# Patient Record
Sex: Male | Born: 1940 | Race: White | Hispanic: No | Marital: Married | Smoking: Former smoker
Health system: Southern US, Community
[De-identification: ages and names within clinical notes are randomized; demographics above are authoritative.]

## PROBLEM LIST (undated history)

## (undated) DIAGNOSIS — G4730 Sleep apnea, unspecified: Secondary | ICD-10-CM

## (undated) DIAGNOSIS — J45909 Unspecified asthma, uncomplicated: Secondary | ICD-10-CM

## (undated) DIAGNOSIS — J309 Allergic rhinitis, unspecified: Secondary | ICD-10-CM

## (undated) HISTORY — DX: Sleep apnea, unspecified: G47.30

## (undated) HISTORY — DX: Allergic rhinitis, unspecified: J30.9

## (undated) HISTORY — PX: ROTATOR CUFF REPAIR: SHX139

## (undated) HISTORY — DX: Unspecified asthma, uncomplicated: J45.909

---

## 2001-10-07 HISTORY — PX: KNEE SURGERY: SHX244

## 2004-05-24 ENCOUNTER — Inpatient Hospital Stay (HOSPITAL_COMMUNITY): Admission: RE | Admit: 2004-05-24 | Discharge: 2004-05-28 | Payer: Self-pay | Admitting: Orthopaedic Surgery

## 2004-10-17 ENCOUNTER — Encounter: Admission: RE | Admit: 2004-10-17 | Discharge: 2004-10-17 | Payer: Self-pay | Admitting: Orthopedic Surgery

## 2004-10-31 ENCOUNTER — Encounter: Admission: RE | Admit: 2004-10-31 | Discharge: 2004-10-31 | Payer: Self-pay | Admitting: Orthopedic Surgery

## 2005-01-14 ENCOUNTER — Ambulatory Visit: Payer: Self-pay | Admitting: Internal Medicine

## 2005-05-14 ENCOUNTER — Ambulatory Visit: Payer: Self-pay | Admitting: Infectious Diseases

## 2005-05-14 ENCOUNTER — Inpatient Hospital Stay (HOSPITAL_COMMUNITY): Admission: RE | Admit: 2005-05-14 | Discharge: 2005-05-17 | Payer: Self-pay | Admitting: Orthopaedic Surgery

## 2005-05-14 ENCOUNTER — Encounter (INDEPENDENT_AMBULATORY_CARE_PROVIDER_SITE_OTHER): Payer: Self-pay | Admitting: *Deleted

## 2005-06-13 ENCOUNTER — Ambulatory Visit: Payer: Self-pay | Admitting: Infectious Diseases

## 2005-06-20 ENCOUNTER — Encounter: Admission: RE | Admit: 2005-06-20 | Discharge: 2005-06-20 | Payer: Self-pay | Admitting: Orthopaedic Surgery

## 2005-06-27 ENCOUNTER — Ambulatory Visit: Payer: Self-pay | Admitting: Infectious Diseases

## 2005-09-18 ENCOUNTER — Inpatient Hospital Stay (HOSPITAL_COMMUNITY): Admission: RE | Admit: 2005-09-18 | Discharge: 2005-09-21 | Payer: Self-pay | Admitting: Orthopaedic Surgery

## 2005-09-18 ENCOUNTER — Encounter (INDEPENDENT_AMBULATORY_CARE_PROVIDER_SITE_OTHER): Payer: Self-pay | Admitting: *Deleted

## 2005-09-18 ENCOUNTER — Ambulatory Visit: Payer: Self-pay | Admitting: Internal Medicine

## 2005-09-19 ENCOUNTER — Encounter: Payer: Self-pay | Admitting: Cardiology

## 2005-09-19 ENCOUNTER — Ambulatory Visit: Payer: Self-pay | Admitting: Cardiology

## 2005-11-01 ENCOUNTER — Ambulatory Visit: Payer: Self-pay | Admitting: Internal Medicine

## 2006-04-08 ENCOUNTER — Ambulatory Visit (HOSPITAL_COMMUNITY): Admission: RE | Admit: 2006-04-08 | Discharge: 2006-04-09 | Payer: Self-pay | Admitting: Orthopaedic Surgery

## 2006-04-08 ENCOUNTER — Ambulatory Visit: Payer: Self-pay | Admitting: Internal Medicine

## 2006-05-09 ENCOUNTER — Ambulatory Visit: Payer: Self-pay | Admitting: Infectious Diseases

## 2006-05-09 ENCOUNTER — Inpatient Hospital Stay (HOSPITAL_COMMUNITY): Admission: RE | Admit: 2006-05-09 | Discharge: 2006-05-12 | Payer: Self-pay | Admitting: Orthopedic Surgery

## 2006-07-09 ENCOUNTER — Ambulatory Visit: Payer: Self-pay | Admitting: Infectious Diseases

## 2006-07-11 DIAGNOSIS — J449 Chronic obstructive pulmonary disease, unspecified: Secondary | ICD-10-CM

## 2006-07-11 DIAGNOSIS — K219 Gastro-esophageal reflux disease without esophagitis: Secondary | ICD-10-CM

## 2006-07-11 DIAGNOSIS — M869 Osteomyelitis, unspecified: Secondary | ICD-10-CM | POA: Insufficient documentation

## 2006-07-31 IMAGING — CR DG CHEST 1V PORT
1 series · 1 of 1 positions shown · non-contrast
Comparison: 05/09/05.

CLINICAL DATA: PICC line placement.
 PORTABLE CHEST RADIOGRAPH ? 05/14/05:

[view not recorded]
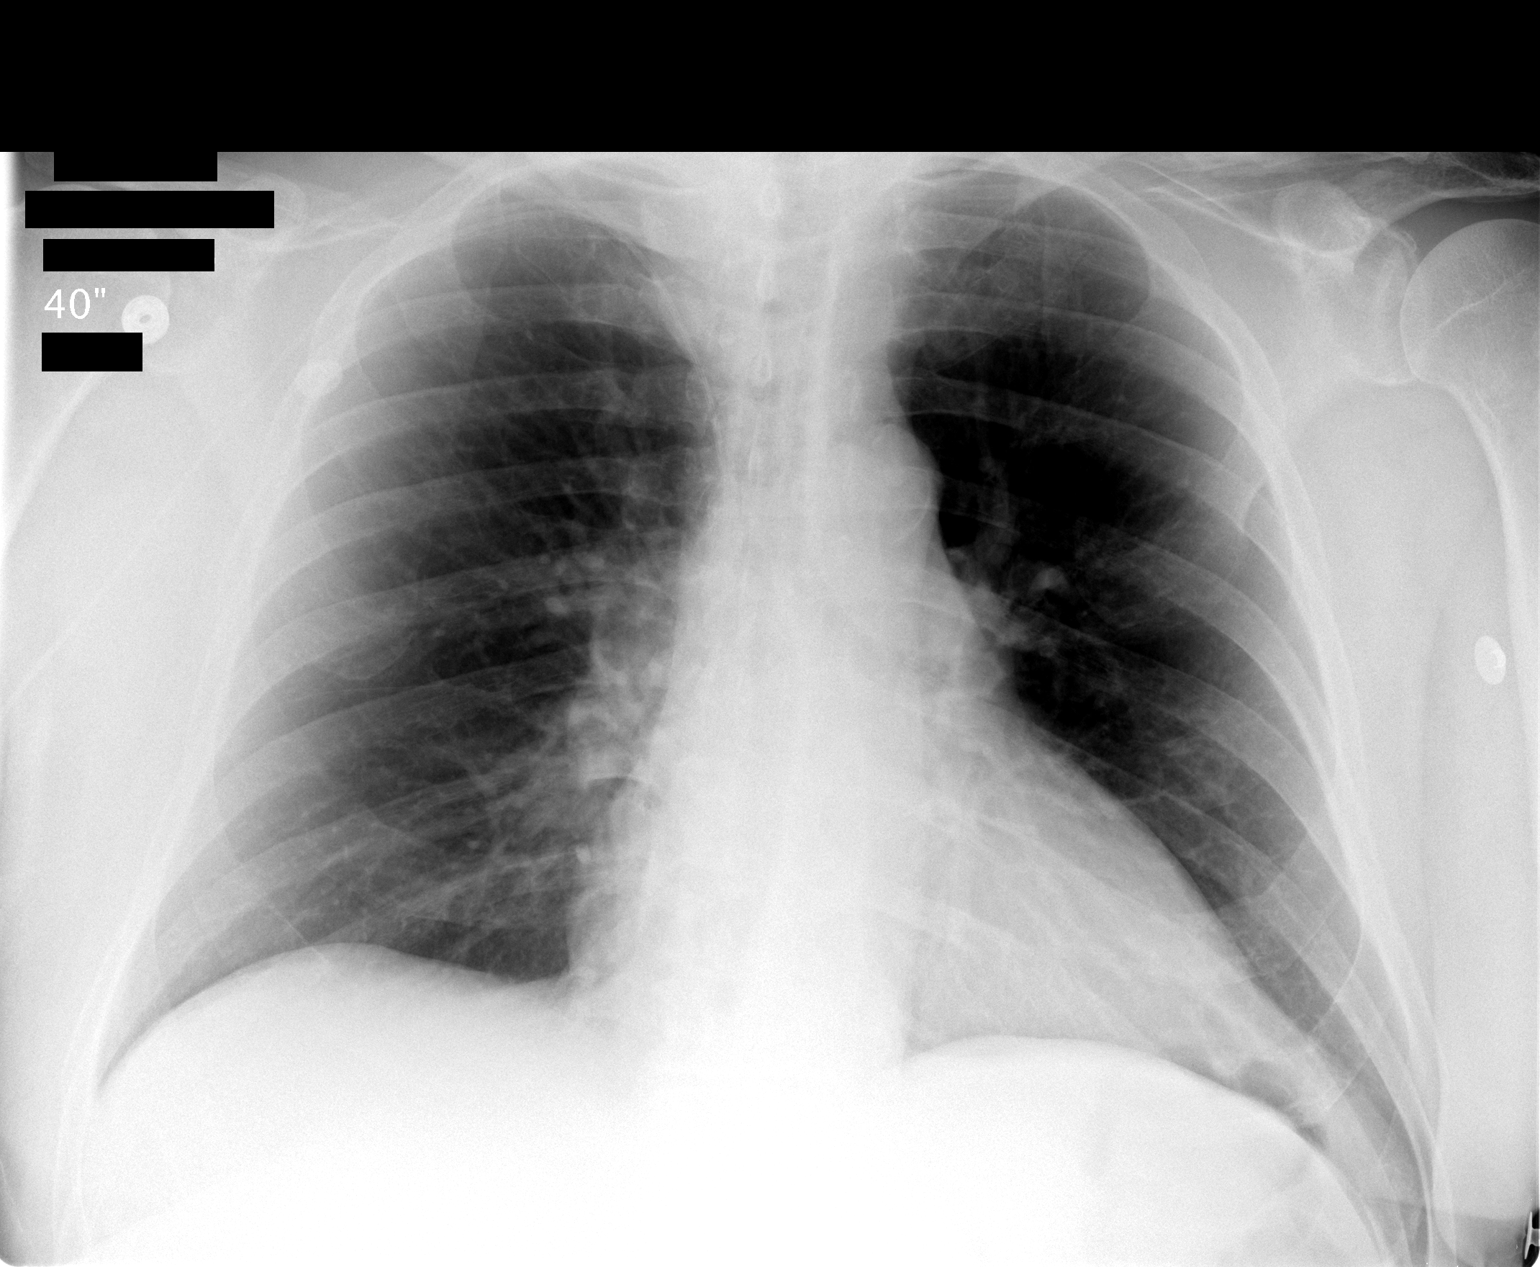

[1 of 1 positions shown; findings below may reference images not displayed]

FINDINGS: A right-sided PICC line is present with the tip in the superior vena cava.  No pneumothorax.  The lungs appear clear.  
 Old left clavicular fracture is again noted.
IMPRESSION: Right-sided PICC line tip is in the superior vena cava.

## 2007-01-02 ENCOUNTER — Ambulatory Visit: Payer: Self-pay | Admitting: Internal Medicine

## 2007-08-04 DIAGNOSIS — G4700 Insomnia, unspecified: Secondary | ICD-10-CM | POA: Insufficient documentation

## 2007-08-04 DIAGNOSIS — J309 Allergic rhinitis, unspecified: Secondary | ICD-10-CM | POA: Insufficient documentation

## 2007-08-04 DIAGNOSIS — G4733 Obstructive sleep apnea (adult) (pediatric): Secondary | ICD-10-CM

## 2007-08-04 DIAGNOSIS — I493 Ventricular premature depolarization: Secondary | ICD-10-CM | POA: Insufficient documentation

## 2007-08-04 DIAGNOSIS — F988 Other specified behavioral and emotional disorders with onset usually occurring in childhood and adolescence: Secondary | ICD-10-CM | POA: Insufficient documentation

## 2007-08-04 DIAGNOSIS — G2589 Other specified extrapyramidal and movement disorders: Secondary | ICD-10-CM | POA: Insufficient documentation

## 2008-01-05 ENCOUNTER — Ambulatory Visit: Payer: Self-pay | Admitting: Internal Medicine

## 2009-05-08 ENCOUNTER — Telehealth (INDEPENDENT_AMBULATORY_CARE_PROVIDER_SITE_OTHER): Payer: Self-pay | Admitting: *Deleted

## 2009-05-09 ENCOUNTER — Telehealth: Payer: Self-pay | Admitting: Internal Medicine

## 2009-05-09 ENCOUNTER — Ambulatory Visit: Payer: Self-pay | Admitting: Internal Medicine

## 2009-05-09 DIAGNOSIS — J40 Bronchitis, not specified as acute or chronic: Secondary | ICD-10-CM | POA: Insufficient documentation

## 2009-05-09 DIAGNOSIS — H6980 Other specified disorders of Eustachian tube, unspecified ear: Secondary | ICD-10-CM

## 2009-05-15 ENCOUNTER — Telehealth (INDEPENDENT_AMBULATORY_CARE_PROVIDER_SITE_OTHER): Payer: Self-pay | Admitting: *Deleted

## 2009-05-22 ENCOUNTER — Encounter: Payer: Self-pay | Admitting: Internal Medicine

## 2009-06-21 ENCOUNTER — Encounter: Payer: Self-pay | Admitting: Internal Medicine

## 2010-04-10 ENCOUNTER — Telehealth: Payer: Self-pay | Admitting: Internal Medicine

## 2010-05-10 ENCOUNTER — Telehealth: Payer: Self-pay | Admitting: Internal Medicine

## 2010-06-08 ENCOUNTER — Ambulatory Visit: Payer: Self-pay | Admitting: Internal Medicine

## 2010-11-06 NOTE — Progress Notes (Signed)
Summary: nos appt  Phone Note Call from Patient   Caller: [email or web address]  Call For: young Summary of Call: Rsc nos from 8/3 to 9/2 @ 3:15p. Pt states he didn't realize he had appt yesterday, didn't get a call. Initial call taken by: Darletta Moll,  May 10, 2010 10:13 AM

## 2010-11-06 NOTE — Assessment & Plan Note (Signed)
Summary: [name]   CC:  yearly follow up - states breathing is doing well.  no new complaints.  History of Present Illness: 01/05/08- One-year follow-up visit.  He describes a complicated series of repeated surgeries after a knee replacement got infected.  Then, he had a rotator cuff repair, and that also got infected.  He has worked with Dr. Maurice March.  Through this his breathing has been pretty good.  He does recognize the spring pollen is causing mild chest congestion now, but he feels his medications are adequate.  We also talked about his chronic insomnia problem and reviewed sleep habits and good sleep hygiene again.  Temazepam  has not been as effective.  We discussed and are going to try Lunesta. Post op leg pains wake him.  05/09/09- Asthma, rhinitis, hx OSA 4 weeks ago caught cold from grandchildren. Has had persitent cough wiuth phlegm, but despite efforts of his primary MD, he has been stopped up in head, very decreased hearing. Has  been falling out of bed. Denies worse than normal sinus congestion. Coughing up green. Took a sulfa drug x 10 days, nasal sprays. Using Nasonex. Tried Afrin.  2010-07-08- asthma, rhinitis, hx OSA Stable mostly. About 3x/yr gets cough needing cough syrup, self limited.  Uses Advair once daily and continues Theochron and Albuterol tablets especially if in full pollen season. Very occasional use of rescue inhaler.  Asthma History    Initial Asthma Severity Rating:    Age range: 12+ years    Symptoms: 0-2 days/week    Nighttime Awakenings: 0-2/month    Interferes w/ normal activity: no limitations    SABA use (not for EIB): 0-2 days/week    Asthma Severity Assessment: Intermittent   Preventive Screening-Counseling & Management  Alcohol-Tobacco     Smoking Status: quit > 6 months     Year Quit: 1965  Current Medications (verified): 1)  Prilosec 20 Mg Cpdr (Omeprazole) .... One Bid 2)  Albuterol Sulfate 4 Mg Tabs (Albuterol Sulfate) .... One Bid 3)   Advair Diskus 250-50 Mcg/dose Misc (Fluticasone-Salmeterol) .... Inhale One Puff Two Times A Day   Rinse Mouth After Each Use 4)  Theochron 200 Mg Tb12 (Theophylline) .... One Bid 5)  Nasonex 50 Mcg/act Susp (Mometasone Furoate) .... 2 Sprays in Each Nostril Once Daily 6)  Furosemide 40 Mg Tabs (Furosemide) .... One Half Tablet in The Am 7)  Claritin 10 Mg Tabs (Loratadine) .... Take 1 Tablet By Mouth Once A Day As Needed 8)  Albuterol 90 Mcg/act Aers (Albuterol) .... Prn 9)  Lopressor 50 Mg Tabs (Metoprolol Tartrate) .... One Half Tablet Am and Pm 10)  Valium 5 Mg  Tabs (Diazepam) .... Use As Directed As Needed 11)  Promethazine-Codeine 6.25-10 Mg/60ml Syrp (Promethazine-Codeine) .... Take 1 Tsp Every Four Hours As Needed For Cough  Allergies (verified): 1)  ! Penicillin 2)  ! 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  Married 2 dogs and 2 cats  Risk Factors: Smoking Status: quit > 6 months (07-08-2010)  Past Medical History: Asthma GERD Insomnia Rhinitis  Family History: Father- died DM, CHF age 62 Mother- died unknown cause, age 25  Social History: Patient states former smoker.  Married 2  dogs and 2 cats Smoking Status:  quit > 6 months  Review of Systems      See HPI       The patient complains of shortness of breath with activity and nasal congestion/difficulty breathing through nose.  The patient denies shortness of breath at rest, productive cough, non-productive cough, coughing up blood, chest pain, irregular heartbeats, acid heartburn, indigestion, loss of appetite, weight change, abdominal pain, difficulty swallowing, sore throat, tooth/dental problems,  headaches, itching, rash, change in color of mucus, and fever.    Vital Signs:  Patient profile:   70 year old male Weight:      235.38 pounds O2 Sat:      96 % on Room air Pulse rate:   72 / minute BP sitting:   140 / 66  (right arm) Cuff size:   regular  Vitals Entered By: Boone Master CNA/MA (June 08, 2010 3:57 PM)  O2 Flow:  Room air CC: yearly follow up - states breathing is doing well.  no new complaints Comments Medications reviewed with patient Daytime contact number verified with patient. Boone Master CNA/MA  June 08, 2010 3:58 PM    Physical Exam  Additional Exam:  General: A/Ox3; pleasant and cooperative, NAD,  SKIN: no rash, lesions NODES: no lymphadenopathy HEENT: [place]/AT, EOM- WNL, Conjuctivae- clear, PERRLA, TM-WN, and clearL, Nose- nasal, Throat- clear and wnl, MallampatiI II NECK: Supple w/ fair ROM, JVD- none, normal carotid impulses w/o bruits Thyroid- CHEST: Clear to P&A,  without dullness or rhonchi HEART: RRR, no m/g/r heard ABDOMEN: Overweight ZOX:WRUE, nl pulses, no edema  NEURO: Grossly intact to observation      Impression & Recommendations:  Problem # 1:  ASTHMA (ICD-493.90) We discussed his meds. He feels comfortable continuing Theochron and Albuterol tabs, but we discussed these. I would prefer he use the Advair two times a day and minimize these others. I encouraged him to try skipping doses as a way to test his need.  Problem # 2:  ALLERGIC RHINITIS (ICD-477.9) Occasional need for antihistamines. i asked he use these and occasional decongestant . If he has flare he may need additional steroid nasal spray. His updated medication list for this problem includes:    Nasonex 50 Mcg/act Susp (Mometasone furoate) .Marland Kitchen... 2 sprays in each nostril once daily    Claritin 10 Mg Tabs (Loratadine) .Marland Kitchen... 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  Medications Added to Medication List This Visit: 1)  Claritin 10 Mg Tabs (Loratadine) .... Take 1 tablet by mouth once a day as needed  Other Orders: Est. Patient Level III (45409) Flu Vaccine 66yrs + (81191) Administration Flu vaccine - MCR (Y7829)  Patient Instructions: 1)  Please schedule a follow-up appointment in 1 year. 2)  Call as needed  3)  consider trying without Theochron and Albuterol tablets some to see if they are really worth taking. 4)  Flu vax   Immunization History:  Influenza Immunization History:    Influenza:  historical (07/07/2009)   Flu Vaccine Consent Questions     Do you have a history of severe allergic reactions to this vaccine? no    Any prior history of allergic reactions to egg and/or gelatin? no    Do you have a sensitivity to the preservative Thimersol? no    Do you have  a past history of Guillan-Barre Syndrome? no    Do you currently have an acute febrile illness? no    Have you ever had a severe reaction to latex? no    Vaccine information given and explained to patient? yes    Are you currently pregnant? no    Lot Number:[id number]   Exp Date:04/06/2011   Site Given  Left Deltoid IMflu   Randell Loop CMA  June 08, 2010 4:47 PM

## 2010-11-06 NOTE — Progress Notes (Signed)
Summary: Theophylline Refill  Phone Note Refill Request Message from:  Fax from Pharmacy on April 10, 2010 3:58 PM  Refills Requested: Medication #1:  THEOCHRON 200 MG TB12 one BID   Dosage confirmed as above?Dosage Confirmed   Brand Name Necessary? No   Supply Requested: 1 month   Last Refilled: 01/31/2010   Notes: Last seen by Dr. Maple Hudson on 05/09/2009 [facility],    Method Requested: Electronic Next Appointment Scheduled: 05/09/2010 w/ Dr. Maple Hudson Initial call taken by: Michel Bickers CMA,  April 10, 2010 3:59 PM  Follow-up for Phone Call        Is this okay to fill? Please advise.Michel Bickers CMA  April 10, 2010 4:00 PM  Additional Follow-up for Phone Call Additional follow up Details #1::        ok to refill x 5 months Additional Follow-up by: [name] MD,  April 11, 2010 8:49 AM    Prescriptions: THEOCHRON 200 MG TB12 (THEOPHYLLINE) one BID  #60 x 5   Entered by:   Randell Loop CMA   Authorized by:   [name] MD   Signed by:   Randell Loop CMA on 04/11/2010   Method used:   Electronically to        [facility] (retail)       [phone]       [place], [place]  [id number]       Ph: [phone]       Fax: [phone]   RxID:   [id number]

## 2011-02-22 NOTE — Discharge Summary (Signed)
NAMENORRIS, [name]                 ACCOUNT NO.:  [id number]   MEDICAL RECORD NO.:  [id number]          PATIENT TYPE:  INP   LOCATION:  5019                         FACILITY:  [facility]   PHYSICIAN:  Claude Manges. Whitfield, M.D.DATE OF BIRTH:  December 18, 1940   DATE OF ADMISSION:  05/14/2005  DATE OF DISCHARGE:  05/17/2005                                 DISCHARGE SUMMARY   ADMISSION DIAGNOSIS:  1.  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  2.  Asthma.  3.  Reflux disease.  4.  Peripheral edema.  5.  History of ulcers.   DISCHARGE DIAGNOSIS:  1.  Status post left total knee arthroplasty hardware removal with      antibiotic spacer replacement.  2.  Hypokalemia, resolved.  3.  Acute blood loss anemia secondary to surgery requiring no blood      transfusions.  4.  History of asthma.  5.  History of reflux disease.  6.  History of peripheral edema.  7.  History of ulcers.   HISTORY OF PRESENT ILLNESS:  Mr. Gatliff is a 70 year old black male with a  history of left total knee arthroplasty August 2005.  The patient with an  increased discomfort in the left knee with some increased edema.  The  patient notes that occasionally pain radiates down the leg on the lateral  aspect.  Evaluation of the knee prosthesis radiographically showed no  loosening of the prosthesis, but the aspiration did show white count of  46,000 with no growth.  The patient assumed to have infected left total knee  arthroplasty.  The patient was placed on Cipro and _________ prior to being  hospitalized to undergo left total knee arthroplasty hardware removal with  antibiotic spacer on May 14, 2005.   ALLERGIES:  PENICILLIN.   MEDS:  1.  Theophylline 200 mg on p.o. b.i.d.  2.  Nexium 40 mg one p.o. daily.  3.  Albuterol 4 mg p.o. b.i.d.  4.  Lasix 20 mg p.o. b.i.d.  5.  Diclofenac 75 mg one p.o. b.i.d.  6.  __________ 300 mg one p.o. b.i.d.  7.  Cipro 500 mg one p.o. b.i.d.  8.  Advair 250/50 one puff b.i.d.  9.  Nasonex one spray b.i.d.  10. Temazepam 30 mg one p.o. q.h.s. p.r.n.   SURGICAL PROCEDURE:  The patient was taken to the operating room on May 14, 2005 by Dr. Norlene Campbell assisted by Nathanial Rancher, P.A.  The patient  was placed under general anesthesia and left total knee arthroplasty  components were removed.  The patient underwent the synovectomy and  antibiotic spacer was inserted.  The patient tolerated the procedure well  and returned to recovery in good stable condition.   HOSPITAL COURSE:  Postoperatively the patient was evaluated by Infectious  Disease and was placed on Vancomycin based on intraoperative cultures which  were negative.  The patient was being treated for Staph.   On postoperative day one the patient's T-max of 100.2, otherwise vital signs  stable.  H&H stable at 11.0  and 32.7.  The patient's hypokalemia went from  3.3, and potassium was replaced.   Postoperative day two the patient afebrile, vital signs stable, H&H 10.4 and  30.9, potassium had increased to 3.5.  The patient otherwise without chest  pain, shortness of breath, nausea or vomiting.  Tolerating diet.  Pain under  adequate control.  On postoperative day three the patient afebrile, vital  signs stable.  H&H was 10.4 and 30.8.  Cultures remained negative. The  patient to be discharged home on Vancomycin per pharmacy  protocol, follow-  up with Dr. ___________.   LABS:  Routine labs on admission, CBC:  White count 7,500, hemoglobin 13.1,  hematocrit 40.0, platelets 393.  Coags on admission, all values within  normal limits.  Routine chemistries on admission, all values within normal  limits.  Hepatic enzymes on admission, all values within normal limits.  Synovial fluid count on May 14, 2005 color was abnormal red, appearance  turbid, white count blood cells were 46,000, neutrophils 97 high and  monocyte macrophage low at 2.  Urinalysis on admission was  negative.  Anaerobic cultures were negative.  Wound tissue cultures of the left knee,  Gram smear was negative x3 days.  Chest x-ray May 14, 2005 showed right-  sided PICC line tip in superior vena cava.  No pneumothorax, lungs otherwise  clear.  EKG on May 09, 2005 showed normal sinus rhythm with a heart rate  of 74 beats per minute, P-R interval was 146.   DISCHARGE INSTRUCTIONS:  MEDS:  The patient was to resume home meds except  for no Cipro or ___________ and no diclofenac while on Coumadin.  The  following meds will be added:  1.  Coumadin as directed by pharmacy at 6 p.m.  ____________.  2.  The patient was to take 1-1/2 5 mg tablets on Friday, Saturday and      Sunday, check blood on Monday and dose to be adjusted by home health      pharmacy at that time.  3.  OxyContin 10 mg one tablet every 12 hours p.r.n. pain.  4.  Percocet 5/325 1-2 tablets every 4-6 hours as needed for breakthrough      pain.  5.  Robaxin 500 mg 1-2 tablet every six hours as needed for spasm.  6.  Vancomycin 1250 mg q.24h. IV dose may be adjusted as outpatient [facility]      pharmacy.  Last dose is to be on June 11, 2005.  7.  Diet -- no restrictions.  8.  Activity -- the patient is weight-bearing as tolerated on the left leg,      use a walker.  9.  Wound care -- the patient is to keep wound clean and dry.  May shower      after no drainage from wound x2 days.  The patient is to notify Dr.      Hoy Register office for temps greater than 101.5, chills, pain not      relieved by meds, foul smelling drainage from wound.   FOLLOWUP:  The patient needs to follow-up with Dr. Cleophas Dunker in 7-10 days in  his office.  The patient to call for an appointment at [phone].   The patient is to follow-up with Dr. Maurice March in Infectious Disease in three  weeks.  The patient is to call for an appointment [phone].   Home health per [facility].      Richardean Canal, P.A.      Theron Arista  Kathi Der,  M.D.  Electronically Signed    GC/MEDQ  D:  07/25/2005  T:  07/25/2005  Job:  [id number]   cc:   Fransisco Hertz, M.D.  Fax: [phone]

## 2011-02-22 NOTE — H&P (Signed)
[name], [name]                             ACCOUNT NO.:  [id number]   MEDICAL RECORD NO.:  [id number]                   PATIENT TYPE:  INP   LOCATION:  NA                                   FACILITY:  [facility]   PHYSICIAN:  Claude Manges. Cleophas Dunker, M.D.            DATE OF BIRTH:  September 14, 1941   DATE OF ADMISSION:  05/24/2004  DATE OF DISCHARGE:                                HISTORY & PHYSICAL   CHIEF COMPLAINT:  Difficulty with range of motion, left knee.   HISTORY OF PRESENT ILLNESS:  The patient is a 70 year old white male with a  history of several-year progressively worsening difficulty with his left  knee.  The patient states that he has stiffness in the knee and difficulty  with initiation of ambulation due to instability in the knee and discomfort.  He does have mechanical symptoms, including popping and grinding.  He does  have sharp, shooting pains in the knee with awkward movements.  He does have  pain radiating down into the calf with muscle soreness and discomfort.  He  does have generalized deep aching sensation within the knee, which increases  with cold temperatures.   X-rays reveal end-stage osteoarthritis bilateral knees, left knee with bone-  on-bone medial compartment with large osteophytes and arthrosis of the  patellofemoral and lateral compartment.   ALLERGIES:  PENICILLIN and STRAWBERRIES.   CURRENT MEDICATIONS:  1. Albuterol sulfate 4 mg tablets, one tablet twice a day.  2. Albuterol MDI p.r.n.  3. Temazepam 30 mg p.o. q.h.s. p.r.n.  4. Diclofenac 75 mg one tablet p.o. b.i.d.  5. Nexium 40 mg p.o. daily.  6. Advair Diskus 250 mg one puff b.i.d.  7. Theophylline 200 mg p.o. b.i.d.  8. Beconase inhaler one puff b.i.d.  9. Vicodin p.r.n.   PAST MEDICAL HISTORY:  1. Significant asthma, which is fairly well-controlled, mostly exercise and     environmental triggers.  2. History of hiatal hernia.  3. History of esophageal strictures.  4. Significant  claustrophobia.   PAST SURGICAL HISTORY:  1. Tonsillectomy without any complications.  2. Esophageal stricture stretching x2 without any complications.   SOCIAL HISTORY:  The patient is a 70 year old slightly heavy-set white male.  He denies any history of smoking.  He does use occasional alcoholic  beverage.  He is married.  He does have several grown children.  He lives in  a [place] home.  He is currently employed as a Producer, television/film/video.   PRIMARY CARE PHYSICIAN:  Tarri Fuller, M.D.   Pulmonologist is Clinton D. Maple Hudson, M.D.   FAMILY MEDICAL HISTORY:  Mother is deceased from age-related issues, as is  his father.  He has one sister, alive in good medical health.   REVIEW OF SYSTEMS:  Positive for bilateral hearing aids on occasion,  otherwise just hard of hearing.  He does have some shortness of breath with  exertion  related to his asthma, which is fairly well-controlled on current  medications.  He does have occasional problems with reflux, which is fairly  well-controlled on the Nexium.   PHYSICAL EXAMINATION:  VITAL SIGNS:  Height is 5 feet 9 inches, weight is  259 pounds.  Blood pressure is  168/78, pulse is 76  and regular,  respirations 14 without exertion.  The patient is afebrile.  HEENT:  The patient is a healthy-appearing, slightly heavy-set, centrally  obese 70 year old white male.  He ambulates with a slow, deliberate walk.  He is able to get on and off the exam table without any obvious discomfort  or shortness of breath.  HEENT:  Head was normocephalic.  Pupils equal, round, and reactive and  accommodating to light.  Extraocular movements intact.  Sclerae were not  icteric.  External ears were without deformities.  Patient is slightly hard  of hearing.  He does not have his hearing aids in at this time.  Nasal  septum was midline.  Oral buccal mucosa is pink and moist and without  lesions.  NECK:  Supple, no palpable, lymphadenopathy.  Thyroid  region was nontender.  The patient had fairly good range of motion of his cervical spine without  any difficulty or tenderness.  CHEST:  Lung sounds were clear, no wheezes, rales, or rhonchi.  Lung sounds  were slightly distant.  CARDIAC:  Regular rate and rhythm, no murmurs, rubs, or gallops.  ABDOMEN:  Round, obese-like, soft, nontender, no hepatosplenomegaly  palpable.  CVA region was nontender.  EXTREMITIES:  Upper extremities were symmetric in size and shape.  He had  good range of motion of his shoulders, elbows, and wrists.  Motor strength  is 5/5.  Lower extremities:  Right and left hip had full extension, flexion  up to 130 degrees with 20 degrees internal-external rotation without  difficulty.  Bilateral knees were symmetrically sized and shaped.  They were  round and full-appearing, but no palpable effusion, no erythema or  ecchymosis.  He was not specifically tender over the medial or lateral joint  line on either knee.  He had coarse crepitus on the patella.  He had near-  full extension up to about 5 degrees lacking on the left, full extension on  the right, flexion back to 120 degrees bilaterally, no significant  instability.  The calves were nontender.  The ankles were symmetrical with  good dorsiflexion and plantar flexion.  PERIPHERAL VASCULAR:  Carotid pulses were 2+, no bruits.  Radial pulses 2+.  Dorsalis pedis pulses 2+.  He did have some trace pitting edema in the  anterior tibial region but no significant pigmentation changes.  NEUROLOGIC:  The patient was conscious, alert, and appropriate.  Held an  easy conversation with the examiner.  Cranial nerves II-XII were grossly  intact.  He had no gross neurologic defects noted.  BREASTS, RECTAL, GENITOURINARY:  Exams deferred at this time.   IMPRESSION:  1. End-stage osteoarthritis, bilateral knees, bone-on-bone medial     compartment with lateral and patellofemoral arthrosis, large spurs,    currently left knee more  symptomatic than right.  2. Asthma.  3. Hiatal hernia.  4. History of esophageal strictures.  5. Claustrophobia.   PLAN:  The patient will undergo all routine labs and tests prior to having a  left total knee arthroplasty by Dr. Cleophas Dunker on May 24, 2004.      Jamelle Rushing, P.A.  Claude Manges. Cleophas Dunker, M.D.    RWK/MEDQ  D:  05/16/2004  T:  05/16/2004  Job:  [id number]

## 2011-02-22 NOTE — H&P (Signed)
[name], [name]                 ACCOUNT NO.:  [id number]   MEDICAL RECORD NO.:  [id number]          PATIENT TYPE:  INP   LOCATION:  NA                           FACILITY:  [facility]   PHYSICIAN:  Claude Manges. Whitfield, M.D.DATE OF BIRTH:  [birth year]   DATE OF ADMISSION:  09/18/2005  DATE OF DISCHARGE:                                HISTORY & PHYSICAL   CHIEF COMPLAINT:  Infected left total knee, status post 6 weeks antibiotic  spacer and antibiotic treatment.   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  The patient did  well at first, however, in the fall of 2005, he developed increased swelling  and discomfort in the knee.  The patient was assumed to have infected left  total knee arthroplasty and underwent the removal of the left total knee  arthroplasty component and an antibiotic spacer was placed on May 14, 2005.  The patient then was treated with IV antibiotics through a PICC line  and soon developed a pseudomonas infection that originated from the PICC  line.  The patient was then placed on Cipro for 6 weeks.  He has been off of  the Cipro now since November 13.  Overall his knee now is doing quite well.  He has very little swelling in the knee and is able to ambulate well with  the assistance of a cane.  The patient is now ready to undergo a left total  knee arthroplasty revision on September 18, 2005.   ALLERGIES:  PENICILLIN.  Questionable WHEAT allergy.   CURRENT MEDICATIONS:  1.  Temazepam 30 mg p.o. nightly p.r.n.  2.  Theophylline 200 mg p.o. b.i.d.  3.  AcipHex 20 mg one daily nightly.  4.  Prilosec 20 mg two tablets every other day.  5.  Albuterol  4 mg p.o. b.i.d.  6.  Lasix 20 mg 1/2 tablet q.a.m. and 1/2 tablet q.p.m.  7.  Diclofenac 75 mg p.o. b.i.d.  8.  Advair 250/50 one puff b.i.d.  9.  Nasonex one puff b.i.d.  10. Benadryl OTC p.r.n.  11. Advil p.r.n. pain.   PAST  MEDICAL HISTORY:  Asthma, peripheral edema, reflux, history of ulcer  many years previous.   PAST SURGICAL HISTORY:  1.  Removal of left knee hardware, antibiotic spacer placement, May 14, 2005.  2.  Left total knee arthroplasty in 2005 with no complications from      anesthesia.  3.  Multiple esophageal dilatation procedures performed in the past with no      complications.  4.  The patient denies any complications of the anesthesia with the above      procedures.   SOCIAL HISTORY:  The patient is a 70 year old white male who denies any  history of smoking, has an occasional alcoholic beverage.  He is married.   PRIMARY CARE PHYSICIAN:  Dr. Debbrah Alar in [place], [place].   FAMILY HISTORY:  Mother is deceased at age 35  felt  to be due to age.  Father deceased with history of DT's, cardiac disease, congestive heart  failure, and diabetes.  He has one sister who is alive without significant  medical history.   REVIEW OF SYSTEMS:  The patient denies any recent cold or cough-like  symptoms.  Denies any chest pain, shortness of breath, PND, or orthopnea.  However, he does have shortness of breath with exertion which is related to  his asthma, but this only occurs if he is not taking his medications.  He is  hard of hearing, however, does not wear a hearing aid.  He does have gastric  reflux which is poorly controlled with his current regimen.  Otherwise  review of systems is negative or noncontributory.   PHYSICAL EXAMINATION:  GENERAL:  The patient is a well-developed, well-  nourished, male who walks with an antalgic gait and uses a cane.  His left  leg has limited range of motion.  The patient's mood and affect are  appropriate, talks easily with examiner.  VITAL SIGNS:  Height 5 feet 9 inches, weight 202 pounds.  Temperature 96.7  degrees Fahrenheit, blood pressure 110/78, pulse 62, respiratory rate 12.  HEENT:  Head is normocephalic and atraumatic without frontal  maxillary sinus  tenderness.  Sclerae anicteric, PERRLA, EOM's intact.  Conjunctivae pink and  moist.  Ears are without external ear deformities.  TM's are pearly gray  bilaterally.  Buccal mucosa is pink and moist and the patient has good  dentition.  Nose; nasal septum midline.  Nasal mucosa is pink and moist  without polyps.  NECK:  No lymphadenopathy.  The patient has good range of motion of the  cervical spine without pain.  No tenderness with palpation over the cervical  spine.  Carotids are 2+ bilaterally without bruits.  HEART:  Regular rate and rhythm, occasional skipped beat.  No murmurs, rubs,  or gallops noted.  LUNGS:  Clear to auscultation bilaterally.  No wheezing, rhonchi, or rales  noted.  ABDOMEN:  Soft and nontender.  Bowel sounds x4 quadrants.  No hepatomegaly  or splenomegaly noted.  NEUROLOGY:  The patient is alert and oriented x3.  Cranial nerves II-XII  grossly intact.  BREASTS:  GENITOURINARY:  RECTAL:  All deferred at this time.  EXTREMITIES:  The patient has full range of motion of the upper extremities  without pain.  Upper extremities are symmetric in size and shape  bilaterally.  Lower extremities; right hip has full range of motion without  pain.  Left hip is not examined due to the patient's left knee with very  little range of motion and he is unable to bend it greater than 10-15  degrees.  Right knee; full range of motion.  No effusion, no edema.  He has  full extension and flexes back to 110 to 115 degrees easily.  Palpation  along the joint line reveals no tenderness.  Left knee; slight edema, no  core, no erythema.  Midline incision is healed well.  No effusion.  Again he  has full extension and is able to flex 10 to 15 degrees at most.   Left lower extremity is mildly edematous, but not pitting.  Dorsal and pedal pulses are 2+ bilaterally.  He has full dorsiflexion and plantar flexion of  both ankles.  He has good sensation to light touch in the  toes bilaterally.   IMPRESSION:  1.  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  2.  Asthma.  3.  Gastroesophageal reflux disease.  4.  Peripheral edema.  5.  History of ulcers.   PLAN:  The patient is to be admitted to [facility] on September 18, 2005, to undergo a left total knee arthroplasty revision by Dr. Cleophas Dunker.  Prior to surgery, the patient had all preoperative labs and testing.      Richardean Canal, P.A.      Claude Manges. Cleophas Dunker, M.D.  Electronically Signed    GC/MEDQ  D:  09/12/2005  T:  09/12/2005  Job:  [id number]

## 2011-02-22 NOTE — Assessment & Plan Note (Signed)
[facility]                             PULMONARY OFFICE NOTE   ICKER, SWIGERT                          MRN:          [id number]  DATE:01/02/2007                            DOB:          07-24-1941    PROBLEM:  1. Chronic asthma/bronchitis.  2. Allergic rhinitis.  3. History of obstructive sleep apnea.  4. Periodic limb movement.  5. Adult attention deficit disorder.  6. Insomnia.  7. PVC's.   HISTORY:  Last seen in April of 2006, coming now to renew contact and  wanting refill on Jules Schick has worked well for insomnia.  With  weight loss his obstructive sleep apnea component has improved .  He is  very satisfied with his respiratory status and says that he does not  recognize allergic rhinitis or asthma.  Living now with a number of  animals including cats, dogs and hoarse's.  His primary physician gets  chest x-ray occasionally.  There has been no cough, significant phlegm,  blood or chest pain.   MEDICATIONS:  1. Advair 250/50 mg being used once daily.  2. Albuterol 4 mg tablets used rarely.  3. Theophylline  200 mg b.i.d.  4. Nasonex.  5. Temazepam 30 mg at bed time.  6. Metoprolol 25 mg b.i.d.   Question intolerance to wheat, otherwise no medication allergy.   OBJECTIVE:  Weight 221 pounds, blood pressure 128/78, pulse regular 68,  room air saturation 97%.  LUNGS:  Fields sound clear.  HEART:  Rhythm is regular without murmur or gallop.  He looks quite well.  I find no adenopathy, cyanosis or edema.   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   PLAN:  We discussed options including sleep medications in the context  of sleep apnea and I have agreed to refill Temazepam 30 mg at bedtime  p.r.n.  He will continue present medications, schedule to return in  one  year for follow up, earlier p.r.n.     Clinton D. Maple Hudson, MD, Tonny Bollman, FACP  Electronically Signed    CDY/MedQ  DD: 01/02/2007  DT: 01/02/2007  Job #: [id number]   cc:   Tarri Fuller

## 2011-02-22 NOTE — Op Note (Signed)
NAMEJAESEAN, [name]                             ACCOUNT NO.:  [id number]   MEDICAL RECORD NO.:  [id number]                   PATIENT TYPE:  INP   LOCATION:  2867                                 FACILITY:  [facility]   PHYSICIAN:  Claude Manges. Cleophas Dunker, M.D.            DATE OF BIRTH:  1941-06-09   DATE OF PROCEDURE:  05/24/2004  DATE OF DISCHARGE:                                 OPERATIVE REPORT   PREOPERATIVE DIAGNOSIS:  End stage osteoarthritis, left knee.   POSTOPERATIVE DIAGNOSIS:  End stage osteoarthritis, left knee.   PROCEDURE:  Left total knee replacement.   SURGEON:  Claude Manges. Cleophas Dunker, M.D.   ASSISTANT:  Legrand Pitts. Duffy, P.A.-C.   ANESTHESIA:  General orotracheal.   COMPLICATIONS:  None.   COMPONENTS:  DePuy LCS complete large femoral component, #4 rotating keel  tibia platform with a 10 mm bridging bearing and a metal back 3 peg rotating  patella, all were secured with polymethyl methacrylate.   PROCEDURE:  With the patient comfortable on the operating table and under  general endotracheal anesthesia, the nursing staff inserted a Foley  catheter.  A tourniquet was then applied to the left lower extremity.  The  left lower extremity was prepped with Betadine scrub and then DuraPrep from  the tourniquet to the mid foot, sterile draping was performed.  With the  extremity still elevated, it was Esmarch exsanguinated with a tourniquet at  350 mmHg.  A midline longitudinal incision was made about the left knee  centered about the patella and extending from the superior pouch to the  tibial tubercle.  By sharp dissection, the incision was carried down to the  subcutaneous tissue.  The first layer of capsule was incised in the midline.  A medial parapatellar incision was made with the Bovie.  There was a clear  yellow joint effusion.  The patella was everted 180 degrees and the knee  flexed to 90 degrees.  There were large osteophytes along the medial and  lateral femoral condyle.   There was complete absence of articular cartilage  in the medial compartment with a fixed varus position.  A medial release was  performed.   Preoperatively, we had measured a large femoral component and a #4 rotating  tibial platform.  These were confirmed interoperatively.  The first cut was  made traversing the tibia with a 9 degree posterior inclination.  A 10 mm  flexion and extension gap were perfectly symmetrical.  Osteophytes were  removed from the medial tibia after the medial release.  MCL and LCL  remained intact throughout the operative procedure.  The PCL and ACL were  excise.  Laminar spreaders were inserted in the medial and lateral  compartments to remove osteophytes from the posterior femoral condyle and  medial and lateral menisci and any remnants of ACL and PCL.  Femoral cuts  were then made with symmetrical flexion and extension gap.  Retractors were  placed carefully behind the tibia.  Tibial holes were then made to secure  the keel tibial tray.  The 12 tibial compartment was applied followed by the  10 mm bridging bearing and the large femoral component.  The trial  components were reduced.  The knee was placed through a full range of motion  without malrotation of the components.   The patella was prepared by removing 12 mm of bone leaving 15 mm patellar  thickness.  The tri-peg jig was applied to make the three holes in the  patella.  The trial patella was applied and reduced with a full range of  motion without subluxation.  The trial components were removed, the joint  was copiously irrigated with jet saline, the final components were inserted  with polymethyl methacrylate.  The tibia was removed first, excess  methacrylate was removed with Michaelle Copas.  The 10 mm bridging bearing was  inserted.  The femur was then impacted with methacrylate with excellent  position.  The patella was applied with the bone clamp and methacrylate and  after complete maturation and  hardening of the methacrylate, the joint was  explored.  Any remaining methacrylate was removed with an osteotome.  The  knee was placed through a full range of motion without malrotation of the  components or subluxation of the patella.   The joint was copiously irrigated with saline solution.  Any bleeding bone  edges were covered with bone wax.  The tourniquet was deflated with  immediate capillary refill to the operative site.  A Hemovac was inserted.  The deep capsule was closed with interrupted #1 Ethibond.  The superficial  capsule was closed with a running 0 Vicryl, the subcu was closed with a  running 2-0 Vicryl, and the skin was closed with skin clips.  A sterile,  bulky dressing was applied followed by the patient's support stocking.  The  patient had good pulses.  The patient tolerated the procedure without  complications.                                               Claude Manges. Cleophas Dunker, M.D.    PWW/MEDQ  D:  05/24/2004  T:  05/25/2004  Job:  [id number]

## 2011-02-22 NOTE — H&P (Signed)
[name], [name]                 ACCOUNT NO.:  [id number]   MEDICAL RECORD NO.:  [id number]          PATIENT TYPE:  INP   LOCATION:  NA                           FACILITY:  [facility]   PHYSICIAN:  Claude Manges. Whitfield, M.D.DATE OF BIRTH:  [birth year]   DATE OF ADMISSION:  05/14/2005  DATE OF DISCHARGE:                                HISTORY & PHYSICAL   CHIEF COMPLAINT:  Painful swollen left total knee arthroplasty.   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  Patient overall  had been doing fairly well, but recently had been having problems with  increased swelling and discomfort in his knee.  He said it would  occasionally radiate down the leg and on the lateral aspects.  Evaluation  showed no loosening of the prosthesis but an aspiration did show 46,000  white cells with no growth.  Patient was assumed to have an infected left  total knee arthroplasty.  Dr. Cleophas Dunker did discuss the case with infectious  disease and due to delay of the ability to surgically debride the knee, was  recommended to place him on Cipro and rifampin until the procedure could be  performed on August 8.   ALLERGIES:  PENICILLIN.   CURRENT MEDICATIONS:  1.  Temazepam 30 mg p.o. q.h.s. p.r.n.  2.  Theophylline 200 mg p.o. b.i.d.  3.  Nexium 40 mg p.o. daily.  4.  Albuterol 4 mg p.o. b.i.d.  5.  Lasix 20 mg p.o. b.i.d.  6.  Diclofenac 75 mg p.o. b.i.d.  7.  Rifampin 300 mg p.o. b.i.d.  8.  Cipro 500 mg p.o. b.i.d.  9.  Advair 250/50 one puff b.i.d.  10. Nasonex spray one puff b.i.d.   PAST MEDICAL HISTORY:  1.  Asthma.  2.  Peripheral edema.  3.  Reflux disease.  4.  History of ulcer many years previous.   PAST SURGICAL HISTORY:  1.  Left total knee arthroplasty in 2005 with no complications from      anesthesia.  2.  Patient has also had multiple esophageal stretchings performed in the      past without any  complications.   SOCIAL HISTORY:  Patient is a 70 year old white male.  Denies any history of  smoking.  Does have an alcoholic beverage usually on an average daily basis.  Is married.   Family physician is Dr. Carolin Coy in [facility].   FAMILY HISTORY:  Mother is deceased at the age of 47 due to age issues.  Father is deceased with a history of DTs, cardiac disease, congestive heart  failure, and diabetes.  One sister alive without any significant medical  history.   REVIEW OF SYSTEMS:  Positive for glasses.  He is hard of hearing.  Does not  use his hearing aids due to he feels they do not work very well.  He does  have some shortness of breath with exertion related to his asthma but his  asthma has been fairly well controlled as long as  he is on his medications.  He has never been hospitalized.  He does have issues with reflux but as long  as he is using his Nexium it is pretty well controlled.  Rest of review of  systems are negative.   PHYSICAL EXAMINATION:  VITAL SIGNS:  Height 5 feet 9 inches, weight 210,  blood pressure 152/58, pulse 76 and regular, respirations 12, nonlabored.  Patient is afebrile.  GENERAL:  This is an elderly-appearing white male.  Ambulates with a cane in  his right hand.  He does walk with a limp.  He easily gets on and off the  examination table.  HEENT:  Head is normocephalic.  Pupils are equal, round, and reactive,  accommodating to light.  External ears are without deformities.  Patient is  slightly hard of hearing.  No hearing aids in place.  He does ask you to  repeat occasional questions at normal voice tones.  Oral buccal mucosa is  pink and moist.  NECK:  Supple.  No palpable lymphadenopathy.  Patient has good range of  motion of her cervical spine without any difficulty or tenderness.  Thyroid  region is nontender.  LUNGS:  He did have a very slight wheeze in the right lower base, otherwise  he had lung sounds that were clear and patient  indicates this is a fairly  normal day respiratory wise.  HEART:  Regular rate and rhythm.  No murmurs, rubs, or gallops.  ABDOMEN:  Soft, nontender.  Bowel sounds normoactive.  No CVA region  tenderness.  EXTREMITIES:  Upper extremities were symmetrically size and shape.  He had  good range of motion of his shoulders, elbows, and wrists.  Lower  extremities:  Right and left hip had full extension, flexion up to 130  degrees with 20 degrees internal/external rotation, no discomfort.  Left  knee was rather swollen.  There was no erythema.  Midline incision was  healed very nicely.  It was not appreciable warmer than the right side.  He  had full extension.  He was able to flex it back to 100 degrees fairly  easily.  No instability.  The calves are soft and nontender.  Right knee is  normal-appearing.  No erythema.  No swelling.  He has full range of motion.  The calves are nontender.  Ankles are symmetrical with good dorsiplantar  flexion.  PERIPHERAL VASCULAR:  Carotid pulses were 2+.  No bruits.  Radial pulses  were 2+.  He had 2+ dorsalis pedis in both legs.  He had moderate amount of  swelling in his left lower extremity from the tibia on down and trace edema  in both legs.  No varicosities or pigmentation changes.  NEUROLOGIC:  Patient was conscious, alert, and appropriate.  Ease in  conversation with examiner.  Cranial nerves II-XII grossly intact.  He had  no gross neurologic defects noted.  BREASTS:  Deferred at this time.  RECTAL:  Deferred at this time.  GENITOURINARY:  Deferred at this time.   IMPRESSION:  1.  Infected left total knee arthroplasty, currently taking Cipro and      Rifampin.  2.  Asthma.  3.  Reflux disease.  4.  Peripheral edema.  5.  History of ulcers.   PLAN:  Patient will undergo all other routine laboratories and tests prior  to having an I&D and poly exchange of his left total knee arthroplasty by Dr. Cleophas Dunker at [facility] on August 8.  I  did discuss  the patient  if the prosthesis components were loose that they would probably be removed  and an artificial spacer with antibiotics six to eight weeks was a  possibility.  Otherwise, will go ahead and proceed with just an I&D with  poly exchange.       RWK/MEDQ  D:  05/09/2005  T:  05/09/2005  Job:  [id number]

## 2011-02-22 NOTE — Consult Note (Signed)
NAME:  [name], [name]                 ACCOUNT NO.:  [id number]   MEDICAL RECORD NO.:  [id number]          PATIENT TYPE:  OIB   LOCATION:  4731                         FACILITY:  [facility]   PHYSICIAN:  Pricilla Riffle, M.D.    DATE OF BIRTH:  Sep 13, 1941   DATE OF CONSULTATION:  04/08/2006  DATE OF DISCHARGE:                                   CONSULTATION   IDENTIFICATION:  The patient is a 70 year old gentleman who we were asked to  see regarding SVT.   HISTORY OF PRESENT ILLNESS:  The patient has no known history of coronary  artery disease.  He was seen in December 2006 by Berton Mount for PVCs post  surgery.  Follow-up in clinic, thought to probably be arising from the RV  outflow tract.  The patient was asymptomatic, Rx with Lopressor.  Echocardiogram:  LVF 55-65%, post diastolic dysfunction.  Plan:  Cardiolite.  Patient refused.   The patient denies palpitations.  No syncope, no chest pain, no shortness of  breath, two day postoperative surgery from shoulder.  He was eating and the  food and liquid got stuck in esophagus.  It was painful.  The patient has a  10-beat run of SVT.  Asymptomatic for palpitations.  Tele otherwise shows  PVCs plus sinus rhythm.   ALLERGIES:  PENICILLIN.   MEDICATIONS PRIOR TO ADMISSION:  1.  AcipHex 20 daily.  2.  Theophylline 200 b.i.d.  3.  Prilosec 20 every other day.  4.  Lasix 10 b.i.d.  5.  Metoprolol 25 t.i.d.  6.  Advair 250/50 b.i.d.  7.  Nasonex p.r.n.  8.  Benadryl p.r.n.  9.  Percocet p.r.n.   PAST MEDICAL HISTORY:  1.  PVCs.  2.  Esophageal stricture, status post dilatation.  3.  Asthma.  4.  Gastroesophageal reflux.  5.  ADD.  6.  DJD, status post left TKA with infection x2, first complicated by      infection, left shoulder surgery.   SOCIAL HISTORY:  The patient is married, does not smoke, does not drink.   FAMILY HISTORY:  Noncontributory to arrhythmia.   REVIEW OF SYSTEMS:  All systems reviewed, negative to above problem,  except  as noted above.   PHYSICAL EXAMINATION:  GENERAL:  The patient is currently in no distress.  VITAL SIGNS:  Blood pressure 158/63, pulse is 67 and regular, respiratory  rate is 22, temperature is 97.6.  O2 saturation on room air 94%.  The  patient denies shortness of breath, only pain in left shoulder.  HEENT:  Normocephalic, atraumatic.  PERRL.  NECK:  No bruits, no JVD.  CARDIAC:  Regular rate and rhythm.  S1/S2.  No S3, S4, or murmurs.  LUNGS:  Clear to auscultation.  ABDOMEN:  No hepatosplenomegaly.  Supple.  EXTREMITIES:  Good distal pulses.  No edema.  Left arm in sling.  NE UROLOGIC:  Alert and oriented x3.  Cranial nerves II-XII intact.  Moving  all extremities.   LABORATORY DATA:  A 12-lead EKG was done, last one was December 13, shows  sinus rhythm 65 beats  per minute, PVCs.   None recent.  Telemetry:  Sinus rhythm, PVCs as noted, plus 10 beat SVT.   Labs significant for hemoglobin of 12.7, WBC 7.8.  Potassium 3.8, BUN and  creatinine of 18 and 0.1, INR 1.   IMPRESSION:  The patient is a 70 year old male with PVCs, now with 10 beat  run of SVT and PVCs.  SVT occurred while the patient was having problems  swallowing, in pain, patient asymptomatic, hemodynamically stable.   RECOMMENDATIONS:  Would follow.  Telemetry.  Check potassium and magnesium.  Check CO level.  Would re-add Lopressor which the patient has got at home.  Control pain.  Will continue to follow.           ______________________________  Pricilla Riffle, M.D.     PVR/MEDQ  D:  04/08/2006  T:  04/08/2006  Job:  [id number]

## 2011-02-22 NOTE — Op Note (Signed)
NAME:  [name], [name]                 ACCOUNT NO.:  [id number]   MEDICAL RECORD NO.:  [id number]          PATIENT TYPE:  OIB   LOCATION:  5016                         FACILITY:  [facility]   PHYSICIAN:  Dyke Brackett, M.D.    DATE OF BIRTH:  Oct 21, 1940   DATE OF PROCEDURE:  05/09/2006  DATE OF DISCHARGE:                                 OPERATIVE REPORT   INDICATIONS:  This patient approximately a month ago had an open rotator  cuff repair with an arthroscopy.  He presented at least to the office today  with obvious purulent infection with the need to go to the OR on an emergent  basis.   PREOPERATIVE DIAGNOSIS:  Severe pyarthrosis of left shoulder.   POSTOPERATIVE DIAGNOSIS:  Severe pyarthrosis of left shoulder.   OPERATION:  1. Incision and drainage with debridement of shoulder.  2. Arthrotomy with pulsatile lavage of shoulder.   SURGEON:  Dyke Brackett, M.D.   ANESTHESIA:  General.   BLOOD LOSS:  Minimal.   DESCRIPTION OF PROCEDURE:  Once the skin was opened over the old rotator  cuff repair site the actual infection had eroded through the deltoid repair  and disrupted it.  Finger probing admitted the finger and there was obvious  disruption of the repair by the infection in point of fact there was very  little of the repair intact.  There is probably only one suture holding any  tissue and the tissue itself was severely infected with a lot of necrosis.  The remnants of the suture anchors including one metal anchor were removed.  The rest of the suture material, the infected tissue was cut back.  Inspecting the joint, there was no obvious destruction of the joint  surfaces.  Gram stain and culture was sent.  Six thousand mL of pulsatile  lavage were used to irrigate.  Two Penrose 0.5 inch drains were placed  exiting posterior and posterolaterally to allow the pen drainage.  The  deltoid repair site fortunately could be re-repaired and it was done with a  monofilament Prolene zero  multiple.  Good deltoid repair was obtained.  The  skin was closed with skin clips.  A compressive sterile bulky dressing  applied.  Taken to the recovery room in stable condition.      Dyke Brackett, M.D.  Electronically Signed     WDC/MEDQ  D:  05/09/2006  T:  05/10/2006  Job:  [id number]

## 2011-02-22 NOTE — Discharge Summary (Signed)
NAMEKANIN, LIA                 ACCOUNT NO.:  [id number]   MEDICAL RECORD NO.:  [id number]          PATIENT TYPE:  INP   LOCATION:  5016                         FACILITY:  [facility]   PHYSICIAN:  Dyke Brackett, M.D.    DATE OF BIRTH:  28-Jul-1941   DATE OF ADMISSION:  05/09/2006  DATE OF DISCHARGE:  05/12/2006                                 DISCHARGE SUMMARY   ADMITTING DIAGNOSES:  1. Infected left shoulder, status post left shoulder arthroscopy with mini      open rotator cuff repair on April 08, 2006.  2. History of asthma.  3. History of gastroesophageal reflux disease.  4. Reported history of skipped heart beat.   DISCHARGE DIAGNOSES:  1. Status post I&D of suspected left shoulder infection.  2. Hypokalemia, treated.  3. History of gastroesophageal reflux disease.  4. History of asthma.  5. History of skipped heart beat.   HISTORY OF PRESENT ILLNESS:  Mr. Lamb is a 70 year old white male who  underwent left shoulder arthroscopy with a mini rotator cuff repair on April 08, 2006.  The patient reportedly developed a stitch abscess at two weeks  postop.  The patient was placed on Keflex and monitored.  Now the patient  was seen in the office with a frank pus-covered incision site.  The lateral  part is raised.  Patient admitted to undergo an I&D of the left shoulder for  precipitable infection.   ALLERGIES:  PENICILLIN, QUESTIONABLE BEET ALLERGY.   MEDICATIONS:  1. Prilosec 20 mg one b.i.d.  2. Albuterol sulfate 4 mg one b.i.d.  3. Advair 250/50 daily.  4. Theophylline 200 mg one b.i.d.  5. Nasonex spray p.r.n.  6. Lasix 40 mg half tab p.o. in the a.m.  7. Benadryl 25 mg p.o. p.r.n.  8. Albuterol inhaler p.r.n.  9. Metoprolol 50 mg half tab a.m. and p.m.   SURGICAL PROCEDURE:  The patient was taken to the operating room on May 09, 2006 by Dr. Madelon Lips, placed under general anesthesia and then an I&D with  debridement of the left shoulder was performed with arthrotomy  with a  pulsative lavage of the shoulder.  The patient tolerated the procedure well  and returned to recovery in good and stable condition.   HOSPITAL COURSE:  The patient was afebrile postop day one.  Vital signs  stable.  No new labs were ordered.  Awaiting culture for I&D to see the  patient.  The patient was on intravenous Vancomycin one gram intravenous  q.12h.  The patient was later seen by ID and additional labs were ordered  including uric acid to rule out gout, RA factor, and ANA.  Postop day two  the patient was afebrile.  Cultures no growth x2 days shoulder.  Labs as  ordered by ID unable to be obtained due to phlebotomy attempt being  abandoned due to discomfort.  Penrose drains were advanced, and the patient  was placed on Avalox 400 mg p.o. daily for four weeks.  Intravenous  Vancomycin was discontinued.  Postop day three the patient wanted to go  home.  Left shoulder without pain.  Moderate serous drainage with some  purulent drainage from the left shoulder.  Labs are still pending.  However,  a routine chemistry was available and it showed the patient to be mildly  hypokalemic.  Potassium was replaced.  The patient was to be discharged home  later that day after labs were ordered by the infectious disease were  obtained.   LABS:  Routine labs on admission:  CBC white count was 6,500, hemoglobin  11.4, hematocrit 35.7, coags on admission all values within normal limits.  Routine chemistry:  Sodium 139, potassium 3.7, chloride 105, bicarbonate 26,  glucose 106, BUN 13, creatinine 0.8, calcium 9.1.   Hepatic enzymes on admission:  All values are within normal limits.  UA on  admission:  All values within normal limits.  Gram stain right shoulder  showed no organisms.  No growth x2 days.  Anaerobic cultures right shoulder  no ordinate seen.  No anaerobe isolates.  RA factor was 20, ANA was  negative.  The remainder of the labs ordered including uric acid were not  available  at this time.   DISCHARGE INSTRUCTIONS:   DIET:  No restrictions.   ACTIVITY:  No overhead forward flexion.   WOUND CARE:  The patient to keep the wound clean and dry, change the  dressing daily as needed.  Call if temp is greater than 101.5 or foul-  smelling drainage.   MEDICATIONS:  The patient is to resume home medications as follows:  1. Percocet 5/325 one to two tabs every four to six hours for pain.  2. Avelox 400 mg one tab daily at 6 a.m.  Last dose June 07, 2006.   FOLLOWUP:  The patient needs follow up with Dr. Cleophas Dunker May 13, 2006 in  the office.  The patient is to call the office for appointment.  The patient  is to follow up with Dr. Roxan Hockey in infectious disease.  The patient to  call for the next available appointment.  Phone number is [phone].   CONDITION ON DISCHARGE:  The patient was discharged to home in good and  stable condition.      Richardean Canal, Arnetha Courser, M.D.  Electronically Signed   GC/MEDQ  D:  06/11/2006  T:  06/11/2006  Job:  [id number]   cc:   Dyke Brackett, M.D.

## 2011-02-22 NOTE — Consult Note (Signed)
NAMEBLAYTON, [name]                 ACCOUNT NO.:  [id number]   MEDICAL RECORD NO.:  [id number]          PATIENT TYPE:  INP   LOCATION:  6735                         FACILITY:  [facility]   PHYSICIAN:  Duke Salvia, M.D.  DATE OF BIRTH:  May 21, 1941   DATE OF CONSULTATION:  09/18/2005  DATE OF DISCHARGE:                                   CONSULTATION   REFERRING PHYSICIAN:  Dr. Claude Manges. Whitfield.   COMMENT:  Thank you very much for asking [place] to see [name] in  cardiac consultation because of PVCs following his surgery.   HISTORY OF PRESENT ILLNESS:  [name] is a 70 year old gentleman who was  admitted today for a left knee arthroplasty revision.  He had undergone  surgery in 2005; it was identified about 16 months ago.  It was identified  as infected in August and was removed.  He then had subsequent infections of  his PICC line with Pseudomonas, treated with Cipro and he comes in today for  revision of his knee arthroplasty; that procedure went quite well.  I do not  have the OR record.  The PACU record demonstrates stable hemodynamics.  However, in the PACU, he was noted to have monomorphic ventricular ectopy.  He was thus transferred to telemetry, where he continues to have ventricular  ectopy sometimes in a pattern of bigeminy.  The patient is unaware of this.  The patient has no prior history of syncope.  He has no prior history of  palpitations.  He has no known heart history of at all.  He has had no  stress testing and no echocardiography.  Prior to his knee becoming a  problem, he was able to walk a mile a day.  He has had no nocturnal dyspnea  and no exertional chest discomfort.   His cardiac risk factor are negative for hypertension, diabetes or family  history.  His cholesterol status is not known.   PAST MEDICAL HISTORY:  His past medical history is notable for:  1.  GE reflux disease.  2.  Asthma, on bronchodilators.  3.  Obstructive sleep apnea.  4.   Attention deficit disorder.  5.  Hard of hearing.   PAST SURGICAL HISTORY:  He has had no prior surgeries except for above, as  well as multiple esophageal dilatations.   MEDICATIONS ON ARRIVAL:  1.  Temazepam  30 mg.  2.  Theophylline 200 mg twice daily.  3.  Aciphex 20 mg.  4.  Prilosec 20 mg (?).  5.  Albuterol 4 mg twice daily.  6.  Lasix 10 mg in the morning and 10 mg at night.  7.  Advair 250/50.  8.  Nasonex.  9.  Benadryl.  10. Diclofenac 75 mg twice daily.   ALLERGIES:  He is allergic to PENICILLIN.   SOCIAL HISTORY:  He is married.  He has a couple of children who are here  today.  He is retired from the [employer].   REVIEW OF SYSTEMS:  Noncontributory.   PHYSICAL EXAMINATION:  GENERAL:  He is an elderly  Caucasian male appearing  his stated age of 39.  VITAL SIGNS:  His blood pressure is 144/74, his pulse is 68 and irregular.  HEENT:  Exam demonstrated no icterus or xanthomas.  NECK:  His neck veins were 7-8 cm.  His carotids were brisk and full  bilaterally without bruits.  BACK:  The back was not examined.  LUNGS:  Clear laterally.  HEART:  Heart sounds were regular without murmurs or gallops.  ABDOMEN:  The abdomen was soft with active bowel sounds without midline  pulsation or hepatomegaly.  EXTREMITIES:  Femoral pulses were trace.  Distal pulses were not examined.  NEUROLOGIC:  Exam was grossly normal apart from the hearing and the fact  that the patient kept drifting off to sleep.   LABORATORY AND ACCESSORY CLINICAL DATA:  Electrocardiogram dated today at  1954 hours demonstrated sinus rhythm at 65 with intervals of 0.15/0.10/0.43.  The QTc was a little bit long at 0.45.  There were ventricular ectopic beats  with a pattern suggestive of an origin in the right ventricular outflow  tract.   Laboratories prior to admission included a potassium of 3.8 and a hemoglobin  of 12.1.   IMPRESSION:  1.  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  2.  Status post knee arthroplasty revision.  3.  Pulmonary history notable for:      1.  Asthma.      2.  Obstructive sleep apnea.  4.  Gastrointestinal history notable for:      1.  Gastroesophageal reflux disease.      2.  Esophageal dilatations.   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  Typically, this morphology is consistent  with a benign cause as well as an adrenergic sensitivity.  Certainly, his  postoperative situation may be the cause.  Review of his old charts,  however, do not note any cognizance of this from his previous surgeries.   The other issue is prognostic implications.  That relates to the present  __________  of structural heart disease.   RECOMMENDATIONS:  Recommendations are therefore:  1.  Obtain a 2-D echocardiogram.  2.  Check potassium, magnesium, serial enzymes and a theophylline level.  3.  If hemodynamically stable and PVCs persist in the a.m., I would add a      low-dose beta blocker.  4.  Hopefully, he can be transferred back to Orthopedics for full rehab on      Friday.   Thanks for the consultation.           ______________________________  Duke Salvia, M.D.     SCK/MEDQ  D:  09/18/2005  T:  09/20/2005  Job:  [id number]   cc:   Claude Manges. Cleophas Dunker, M.D.  Fax: [phone]   Tarri Fuller  Fax: [phone]

## 2011-02-22 NOTE — Op Note (Signed)
[name], [name]                 ACCOUNT NO.:  [id number]   MEDICAL RECORD NO.:  [id number]          PATIENT TYPE:  AMB   LOCATION:  SDS                          FACILITY:  [facility]   PHYSICIAN:  Claude Manges. Whitfield, M.D.DATE OF BIRTH:  11/13/1940   DATE OF PROCEDURE:  DATE OF DISCHARGE:                                 OPERATIVE REPORT   Audio too short to transcribe (less than 5 seconds)      Claude Manges. [name], M.D.     PWW/MEDQ  D:  04/08/2006  T:  04/08/2006  Job:  [id number]

## 2011-02-22 NOTE — Op Note (Signed)
[name], [name]                 ACCOUNT NO.:  [id number]   MEDICAL RECORD NO.:  [id number]          PATIENT TYPE:  AMB   LOCATION:  SDS                          FACILITY:  [facility]   PHYSICIAN:  Claude Manges. Whitfield, M.D.DATE OF BIRTH:  1941-04-01   DATE OF PROCEDURE:  04/08/2006  DATE OF DISCHARGE:                                 OPERATIVE REPORT   PREOPERATIVE DIAGNOSES:  1.  Rotator cuff tear with impingement left shoulder.  2.  Degenerative joint disease acromioclavicular joint.   POSTOPERATIVE DIAGNOSES:  1.  Rotator cuff tear with impingement left shoulder.  2.  Degenerative joint disease acromioclavicular joint.   PROCEDURES:  1.  Arthroscopic debridement left shoulder.  2.  Arthroscopic subacromial decompression.  3.  Arthroscopic distal clavicle resection.  4.  Mini open rotator cuff tear repair.   SURGEON:  Claude Manges. Cleophas Dunker, M.D.   ASSISTANT:  Rexene Edison, [facility].   ANESTHESIA:  General orotracheal.   COMPLICATIONS:  None.   HISTORY:  A 70 year old gentleman is over a month status post injury to his  left shoulder where he actually felt something pop.  He has had pain and  weakness with overhead motion with an MRI scan revealing a large tear of the  infra and supraspinatus tendon with retraction.  He also has considerable  degenerative change at the [facility] joint and evidence of impingement.  He is now  to have an arthroscopic evaluation and mini open rotator cuff tear repair.   PROCEDURE:  With the patient comfortable on the operating table and under  general orotracheal anesthesia, the patient was placed in semi-sitting  position with the shoulder frame.  The left shoulder was then placed through  full range of motion without evidence of adhesive capsulitis or instability.  Shoulder was then prepped from the base of neck circumferentially below the  elbow with DuraPrep.  Sterile draping was performed.   Marking pen was used to outline the [facility] joint, the coracoid and  the acromion  at a point a fingerbreadth posterior and medial to the posterior angle  acromion.  A small stab wound was made.  The arthroscope was easily placed  into the shoulder joint.  Diagnostic arthroscopy revealed diffuse synovitis  and an obvious rotator cuff tear such that I could easily visualize the  subacromial space.  The biceps tendon had ruptured and there was a stump  that was waiting between the anterior and the posterior aspect of the joint.  The labrum appeared to be intact.  I did not see any loose bodies.   A second portal was established anteriorly and debridement of the joint was  then performed including the biceps stump and synovitis.   The arthroscope was then placed through subacromial space posteriorly.  The  cannula subacromial space anteriorly and a third portal established in the  lateral subacromial space.  The arthroscopic subacromial decompression was  performed and there was abundant beefy red bursal material.  The rotator  cuff tear was obvious with retraction.  An anterior-inferior acromioplasty  was performed with a 6 mm bursa as there was  obvious overhang of the  acromion both anteriorly and laterally.  The distal clavicle reveals  considerable degenerative change and overhang impinging the rotator cuff.  Distal clavicle was then excised with a 6 mm bur with a nice decompression.   A mini open rotator cuff tear repair was then performed. About an inch and a  half incision was made from the anterior aspect of the acromion distally via  sharp dissection carried down to subcutaneous tissue.  Deltoid fascia was  incised with a Bovie and then bluntly I separated the deltoid fascia.  Self-  retaining retractor was inserted the edge of the rotator cuff was  identified.  Both the infra and supraspinatus tendons had been ruptured.  I  confirmed the fact that there was no biceps tendon in the joint.  A bony  trough was then made along the edge of the acromion.   There was already an  area where there was denuded articular cartilage and this was also  incorporated into the bony trough.  I used three Mitek anchors to advance  the cuff to near its anatomic location on the humeral head and then  supplemented the repair with tendon to tendon repair with #1 Vicryl  posteriorly.  I had a very nice repair.  There was no evidence of any  impingement anteriorly.  The wound was copiously irrigated with saline  solution.  The deltoid fascia was closed with running 0 Vicryl, the subcu  with 2-0 Vicryl, skin closed with skin clips.  Sterile bulky dressing was  applied.  Marcaine with epinephrine was injected in the wound edges.  Sterile bulky dressing was applied and then a sling.   PLAN:  Spend the night, monitor heart rate, vital signs, discharge early in  the morning.      Claude Manges. Cleophas Dunker, M.D.  Electronically Signed     PWW/MEDQ  D:  04/08/2006  T:  04/08/2006  Job:  [id number]

## 2011-02-22 NOTE — Op Note (Signed)
[name], DERUSHA                 ACCOUNT NO.:  [id number]   MEDICAL RECORD NO.:  [id number]          PATIENT TYPE:  INP   LOCATION:  6735                         FACILITY:  [facility]   PHYSICIAN:  Claude Manges. Whitfield, M.D.DATE OF BIRTH:  Mar 11, 1941   DATE OF PROCEDURE:  09/18/2005  DATE OF DISCHARGE:                                 OPERATIVE REPORT   PREOPERATIVE DIAGNOSIS:  Infected left total knee replacement status post  initial debridement, synovectomy, removal of prosthesis, and insertion of  antibiotic spacer.   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   PROCEDURE:  Revision left total knee replacement.   SURGEON:  Claude Manges. Cleophas Dunker, M.D.   ASSISTANT:  Thereasa Distance A. Chaney Malling, M.D.  Legrand Pitts Duffy, P.A.-C.   ANESTHESIA:  General orotracheal with supplemented femoral nerve block.   COMPLICATIONS:  None.   COMPONENTS:  DePuy PFC modular knee system using a number 4 femoral  component with 8 mm posterior augmentation medially and laterally and a 4 mm  distal augmentation (laterally), 18 mm width by 75 mm long fluted rod,  number 3 tibial tray with a 10 mm augmentation medially and a 14 mm fluted  stem, tri peg oval dome polyethylene patella, all components were secured  with polymethyl methacrylate and 1.2 grams tobramycin per bag of PMMA (two  bags).   DESCRIPTION OF PROCEDURE:  With the patient comfortable on the operating  table and under general orotracheal anesthesia, the nursing staff inserted a  Foley catheter.  The left lower extremity was placed in a thigh tourniquet.  The leg was then prepped with Betadine scrub and DuraPrep from the  tourniquet to the mid foot, sterile draping was performed.  With the  extremity still elevated, it was Esmarch exsanguinated with a proximal  tourniquet at 350 mmHg.  The previous anterior longitudinal  incision was  utilized and via sharp dissection, carried down through the subcutaneous  tissue.  The first layer of capsule was incised in the midline.  A medial  parapatellar incision was made with the Bovie.  There was approximately 10  mL of bloody fluid.  Cultures were sent STAT gram stain, there were no  organisms identified, and there were a small number of monocytes.  Permanent  specimens were sent of deep synovium.  The deep capsule was then incised  from the proximal distal extent of the incision.  I had some difficulty  everting the patella because of the scar tissue, so I performed a further  release proximally and distally.  At that point, we could evert the patella  to 90 degrees but was able to visualize the joint by inserting retractors.  There was a partial avulsion of the patellar tendon and this was  subsequently repaired with the Mitek anchor without problems.  A synovectomy  was performed.  There were loose pieces of methacrylate in the anterior and  posterior aspect of the joint.  The femoral spacer was easily removed.  I  removed the stem of the  polyethylene spacer with an osteotome and was able  to then remove the antibiotic PMMA tibial spacer.  Further synovectomy and  soft tissue debridement around the femur and the tibia was then performed.  Retractors were inserted so that we could visualize the tibia which was  initially approached.  Reaming was performed to 14 mm to accept a 14 mm  fluted stem into the tibia.  Using the reamer as a guide, a cut was then  made along the proximal tibia so that we had a nice, flat surface.  The  tibial cut medially was lower than that laterally, so  we made a cut that  would allow a 10 mm augmentation medially and no build up or augmentation  laterally and with that construct, we had very nice apposition of the tibial  component to bone both medially and laterally and with the appropriate  rotation.  Reaming proximally was  performed to accept the fluted stem and  the more fluted area in the proximal and tibial component.  We trialed  several tibial components and felt that the number 3 would be the best size.  We then approached the femur where we sized a number 4.  We required  augmentation posteriorly using 8 mm build up and a 4 mm augmentation  distally and laterally.  Reaming was then performed to the center of the  femoral shaft with an 18 mm reamer at which point we had excellent  apposition and based on the reamer, further cuts were then made to finish  the femoral cuts including the oblique cuts at the posterior, anterior, and  distal cuts.  We then applied the trial femoral component and we had a very  nice fit.   The trial tibial component was applied followed by the trial femoral  component and the TFC 10 mm bridging bearing.  We had excellent stability  with varus and valgus stress.  We had full extension and flexed well beyond  90 degrees.  Technically, the procedure was demanding as we trialed numerous  components before we had the appropriate sizes.  The patella was concave but  thick enough that I could do a clean up cut and then apply the guide for the  three peg polyethylene oval patellar component.  We had a nice deep hole and  the trial component fit very nicely, it did not toggle and did not sublux as  we placed it through a range of motion.   At that point, all the trial components were removed.  We copiously  irrigated the joint with jet saline.  At that point, the tourniquet was up  for about 2 hours 12 minutes, the tourniquet was deflated, we further  debrided the joint, irrigated with jet saline.  After 35 minutes, elevated  it after Esmarch exsanguination to secure the components with polymethyl  methacrylate.  The final tibial component was then assembled, methacrylate  was applied along the bottom of the tibial tray and along the more proximal fluted stem, and then impacted  flush onto the tibia.  Extraneous  methacrylate was removed.  In a similar fashion, the final femoral component  was assembled, all the appropriate augmentations were applied and tightened,  and then it was applied with polymethyl methacrylate.  We used two bags of  polymethyl methacrylate with 1.2 grams of tobramycin in each as the patient  had had a previous Pseudomonas infection that was sensitive to Tobramycin.  A trial spacer was then inserted and the  knee was placed in extension, any  further extraneous methacrylate was removed.  After complete maturation, the  joint was inspected.  The trial tibial polyethylene component was removed  and the final TFC component was then inserted.  There was no opening with  varus or valgus stress, we had full extension and very nice flexion beyond  100 degrees.  At that point, the patella was again cleaned.  We mixed one  further pack of polymethyl methacrylate and then cemented the patellar  component.  A patellar clamp was applied.  Extraneous methacrylate was  removed.  The tourniquet was up for an additional 33 minutes and then it was  deflated with immediate capillary refill.  There was a little bit of  bleeding, therefore, we inserted a Hemovac.  The wound was again irrigated  with saline solution.  The deep capsule was closed with interrupted #1  Ethibond.  The superficial capsule was closed with a running 0 Vicryl, the  subcu was closed with a running 2-0 Vicryl, and the skin was closed with  skin clips.  A sterile, bulky dressing was applied followed by the patient's  support stocking.  The patient tolerated the procedure without  complications.      Claude Manges. Cleophas Dunker, M.D.  Electronically Signed     PWW/MEDQ  D:  09/18/2005  T:  09/19/2005  Job:  [id number]

## 2011-02-22 NOTE — Discharge Summary (Signed)
[name], [name]                 ACCOUNT NO.:  [id number]   MEDICAL RECORD NO.:  [id number]          PATIENT TYPE:  INP   LOCATION:  6735                         FACILITY:  [facility]   PHYSICIAN:  Claude Manges. Whitfield, M.D.DATE OF BIRTH:  [birth year]   DATE OF ADMISSION:  09/18/2005  DATE OF DISCHARGE:  09/21/2005                                 DISCHARGE SUMMARY   ADMITTING DIAGNOSES:  1.  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  2.  Asthma.  3.  Gastroesophageal reflux disease.  4.  Peripheral edema.  5.  History of ulcers.  6.  Obstructive sleep apnea.   DISCHARGE DIAGNOSES:  1.  Status post revision of left total knee arthroplasty.  2.  Acute blood loss anemia secondary to surgery requiring blood      transfusion.  3.  Postoperative PVCs most likely secondary to hyperadrenergic state      associated with surgery.  4.  Asthma.  5.  Gastroesophageal reflux disease.  6.  History of gastric ulcers.  7.  Obstructive sleep apnea.   HISTORY OF PRESENT ILLNESS:  [name] is a 70 year old male with history of  left total knee arthroplasty August of 2005.  Patient initially did well,  however, did develop increased swelling and discomfort in the knee.  Patient  assumed to have infected left total knee arthroplasty and underwent removal  of the left total knee arthroplasty component and antibiotic spacer May 14, 2005.  Patient then was treated IV antibiotics through a PICC line and  seemed to develop a Pseudomonas infection that originated from the PICC  line.  Patient then was placed on Cipro for six weeks.  He has been on Cipro  now since August 19, 2005 and overall his left knee is doing well.  He has  very little swelling in the knee and is able to ambulate with assistance  with a cane.  Patient was admitted September 18, 2005 to undergo removal of  the antibiotic spacer and revision of the left  total knee arthroplasty.   ALLERGIES:  PENICILLIN, questionable WHEAT allergy.   MEDICATIONS:  1.  Temazepam 30 mg p.o. nightly p.r.n.  2.  Theophylline 200 mg p.o. b.i.d.  3.  Aciphex 20 mg daily q.h.s.  4.  Prilosec 20 mg two tablets every other day.  5.  Albuterol 4 mg p.o. b.i.d.  6.  Lasix 20 mg half tablet q.a.m. and half tablet q.p.m.  7.  Diclofenac 75 mg p.o. b.i.d.  8.  Advair 250/50 one puff b.i.d.  9.  Nasonex one puff b.i.d.  10. Benadryl OTC p.r.n.  11. Advil p.r.n. pain.   SURGICAL PROCEDURE:  Patient was taken to the operating room on September 18, 2005 by Dr. Norlene Campbell assisted by Dr. Rinaldo Ratel and Arnoldo Morale, P.A.-C.  Patient underwent general anesthesia with supplemental  femoral nerve block and then a revision of left total knee replacement was  performed.  The following components were used:  A DePuy  PFC modular knee  system using a 4 femoral component with an 8 mm posterior augmentation  medially and laterally, a 4 mm distal augmentation, 18 mm width x 75 mm long  fluted rod, #3 tibial tray with a 10 mm augmentation medially, and a 14 mm  fluted stem, tripeg oval dome, polyethylene patella.  All components were  secured with a polymethyl methacrylate and 1.2 g of tobramycin per bag of  PMMA-two bags.  Patient tolerated procedure well.   Postoperative patient did develop occasional PVCs and [facility] was  consulted.  [facility] did see patient, ordered a 2-D echocardiogram  that day.  Checked potassium, magnesium levels.  Cardiac enzymes were  ordered.  Theophylline level was checked.  Patient was hemodynamically  stable.  They suggested adding a low-dose beta blocker.   CONSULTS:  The following consults were obtained while patient was  hospitalized:  PT, OT, case management, and [facility].   HOSPITAL COURSE:  Postoperative day one patient awake, alert, sitting up in  a chair without complaints of shortness of  breath, chest pain.  Patient's  pain well controlled with PCA Dilaudid.  Patient's H&H 9.3/28.  Postoperative day two patient T-max of 100.4.  H&H 8.3/24.7.  Patient  transfused with 2 units packed red blood cells.  Potassium was 3.4 and was  replaced.  Otherwise, patient's vital signs were all stable.  Echocardiogram  was performed and showed an EF of 55-65%.  On postoperative day three  patient stable, wanting to go home.  No complaints.  No chest pain, nausea,  vomiting, shortness of breath.  Afebrile.  Vital signs stable.  H&H was  8.9/26.2.  Echocardiogram again normal.  Cardiac enzymes negative.  Patient  on beta blocker.  Potassium was 3.6.  Patient placed on Cipro 750 mg p.o.  b.i.d. empirically for increased drainage from left knee.  Patient  progressed well in physical therapy and therefore is felt in stable  condition and ready for discharge.   LABORATORIES:  Routine laboratories on admission dated September 16, 2005:  White blood count 6300, hemoglobin 12.1, hematocrit 36.1, platelets 250.  Coags on admission all values within normal limits.  Routine chemistries on  admission:  Sodium 138, potassium 3.8, chloride 103, bicarbonate 27, glucose  slightly elevated at 100, BUN 15, creatinine 0.9, calcium 9.  Hepatic  enzymes on admission:  All values within normal limits.  Cardiac markers  September 18, 2005 CK was 471 (elevated), CK-MB 13.2 (elevated), relative  index 2.8, troponin was within normal limits at 0.02.  Cardiac markers  September 19, 2005 CK was elevated at 377, CK-MB 6.5 (elevated), relative  index 1.7, within normal limits, troponin 0.02.  Theophylline check on  September 18, 2005 was low at 2.6 and on September 19, 2005 theophylline check  was 1.2.  Urinalysis on admission was negative.  Synovial fluid left knee  obtained on September 18, 2005 showed no growth after three days.  Gram stain obtained September 18, 2005:  Left knee showed moderate white blood cells  present,  predominantly mononuclear, no organisms seen.  Tissue culture  September 18, 2005 left knee showed no growth after two days.  Anaerobic  cultures synovial tissue left knee September 18, 2005:  No anaerobes  isolated.  2-D echocardiogram dated December 15 actual report is not  included in chart, however, is documented in chart that echocardiogram was  normal and EF was 55-65%.   DISCHARGE INSTRUCTIONS:   MEDICATIONS:  1.  Metoprolol 25 mg  three times a day.  2.  Percocet 5/325 one to two tablets every four to six hours for pain.  3.  Coumadin 5 mg, take as directed by pharmacy.  4.  Cipro 750 mg one tablet twice daily x14 days.  5.  Patient is to resume home medications except for Diclofenac.   [facility] to check Coumadin level on Monday, take Coumadin on  Saturday and Sunday as dosed by hospital pharmacy.   DIET:  No restrictions.   ACTIVITY:  Patient is 50% weightbearing on left leg.   WOUND CARE:  Patient is to keep wound clean and dry, change dressing daily.  Patient is to call office if temperature greater than 101.5, foul smelling  drainage, pain is not adequately controlled.  Patient may shower after two  days of no drainage from the wound incision.   Patient is to follow up with Dr. Cleophas Dunker 10 days postoperative.  Patient  is to call office at [phone] for appointment.  Patient is to follow up with  Dr. Graciela Husbands.  He is to contact his office at [phone].   SPECIAL INSTRUCTIONS:  CPM 0-90 degrees six to eight hours a day, increase  by 10 degrees daily.   CONDITION ON DISCHARGE:  Patient was discharged to home in good stable  condition.      Richardean Canal, P.A.      Claude Manges. Cleophas Dunker, M.D.  Electronically Signed    GC/MEDQ  D:  11/19/2005  T:  11/19/2005  Job:  [id number]   cc:   Duke Salvia, M.D.  [address]  [address]  [place]  [place] [address]   Gerlene Burdock ?, M.D.  fax [phone]

## 2011-02-22 NOTE — Discharge Summary (Signed)
NAMECADE, DASHNER                 ACCOUNT NO.:  [id number]   MEDICAL RECORD NO.:  [id number]          PATIENT TYPE:  INP   LOCATION:                               FACILITY:  [facility]   PHYSICIAN:  Claude Manges. Whitfield, M.D.DATE OF BIRTH:  Oct 28, 1940   DATE OF ADMISSION:  05/24/2004  DATE OF DISCHARGE:  05/28/2004                                 DISCHARGE SUMMARY   ADMISSION DIAGNOSES:  1.  End-stage osteoarthritis bilateral knees, bone-on-bone medial      compartment with lateral patellofemoral arthrosis, large spurs,      currently left more symptomatic than right.  2.  Asthma.  3.  Hiatal hernia.  4.  History of esophageal stricture.  5.  Claustrophobia.  6.  Blind.   DISCHARGE DIAGNOSES:  1.  Status post left total knee arthroplasty.  2.  Acute blood loss anemia secondary to surgery, asymptomatic.  3.  Hyponatremia, resolved at time of discharge.  4.  Sinus drainage.  5.  Asthma.  6.  Hiatal hernia.  7.  History of esophageal strictures.  8.  Claustrophobia.  9.  Blind.   HISTORY OF PRESENT ILLNESS:  A 70 year old white male with history of  several years of progressively worsening left knee pain.  The pain is  described as stiffness in the knee that makes it difficult with initial  ambulation due to instability and discomfort.  The patient does have  mechanical symptoms which include popping, grinding.  Has sharp shooting  pains in the knee with movement.  The patient does radiate down into the  calf of the left leg.  The pain does increase with cold temperatures.   X-rays of the both knees reveals end-stage osteoarthritis in both knees.  The left knee actually shows bone-on-bone medial compartment with large  osteophytes and arthrosis of the patellofemoral and lateral compartment.   ALLERGIES:  PENICILLIN and STRAWBERRIES.   CURRENT MEDICATIONS:  1.  Albuterol sulfate 4 mg 1 tablet b.i.d.  2.  Albuterol metered dose inhaler p.r.n.  3.  Temazepam 30 mg p.o. q.h.s.  p.r.n.  4.  Diclofenac 75 mg 1 tablet p.o. b.i.d.  5.  Nexium 40 mg p.o. daily.  6.  Advair Diskus 250 mg 1 puff b.i.d.  7.  Theophylline 200 mg p.o. b.i.d.  8.  Beconase inhaler 1 puff b.i.d.  9.  Vicodin p.r.n.   SURGICAL PROCEDURE:  The patient was taken to the operating room on May 24, 2004, by Dr. Norlene Campbell, assisted by Arnoldo Morale, P.A.  The patient  was placed under general anesthesia, and left total knee replacement was  performed.  The following components were used:  Dupuy LCS complete large  femoral component, #4 rotating keel tibial platform with 10 mm bridge  bearing and a metal-backed, three-peg rotating patella, all secured with  polymethacrylate.  The patient returned to recovery room in good and stable  condition.   CONSULTATIONS:  The following consults were obtained while patient was  hospitalized:  PT, OT, case management, and pharmacy.   HOSPITAL COURSE:  On postop day #1, the patient  was afebrile.  Vital signs  were all stable.  The patient did develop some sinus discharge that was  clear, and patient was placed on Benadryl to help dry the secretions.   On postop day #2, the patient's hemoglobin and hematocrit fell to 11.0 and  31.4, respectively.  The patient was asymptomatic.  The patient also  developed hyponatremia with a sodium of 126, previous day 135.  There was  question if this was a lab error due to patient not being on any diuretics.  The patient was fluid restricted.  Sodium was rechecked in the morning   On postop day #3, the patient's hemoglobin was 10.2, hematocrit 28.7.  The  patient was asymptomatic.  Sodium was 134.  The patient was having trouble  ambulating with crutches and preferred crutches over rolling walker.  Physical therapy recommended patient go home with rolling walker and work  toward safety with crutches.  The patient was held for another day of work  with physical therapy.   On postop day #4, the patient was  discharged home in good and stable  condition.  The patient's vital signs were all stable.  The patient was  afebrile.   LABORATORY AND X-RAY DATA:  CBC on admission:  All values within normal  limits.  Coags on admission:  All values within normal limits.  Routine  chemistries:  Sodium 138, potassium 4.0, chloride 104, bicarb 27, glucose  115, BUN 19, creatinine 1.3.  Urinalysis on admission was negative.   ECG on admission on August 18 showed normal sinus rhythm with heart rate of  83 beats per minute, PR interval 140 msec, PRT axis 51, negative 41 and 106.   DISCHARGE INSTRUCTIONS:   DISCHARGE MEDICATIONS:  1.  May continue with routine home medications and add the following.  2.  OxyContin controlled release 10 mg 1 tablet every 12 hours.  3.  OxyContin IR 5 mg 1 to 2 tablets every 4 to 6 hours p.r.n. pain.  4.  Coumadin 5 mg 1-1/2 tablets 2.5 mg daily unless changed by [facility]      Pharmacy.   ACTIVITY:  The patient is to maintain 50% weightbearing on the left leg with  walker or crutches.  Home health PT with [facility].   DIET:  No restrictions.   WOUND CARE:  The patient is to keep wound clean and dry and may shower after  two days of no drainage from surgical site.  The patient is to change  dressings daily.  The patient is to call Dr. Hoy Register office if there are  any concerns of infection.   SPECIAL INSTRUCTIONS:  1.  CPM at 0 to 70 degrees 8 hours a day, increased to 100 degrees as      tolerated.  2.  Coumadin is to be dosed by [facility].   FOLLOW UP:  The patient is to call office at [phone] for followup with Dr.  Cleophas Dunker.  The patient needs to follow up in the office with Dr. Cleophas Dunker  approximately 10 days from discharge.       ________________________________________  Richardean Canal, P.A.  ___________________________________________  Claude Manges. Cleophas Dunker, M.D.   GC/MEDQ  D:  07/03/2004  T:  07/03/2004  Job:  [id number]

## 2011-02-22 NOTE — Op Note (Signed)
[name], [name]                 ACCOUNT NO.:  [id number]   MEDICAL RECORD NO.:  [id number]          PATIENT TYPE:  INP   LOCATION:  2550                         FACILITY:  [facility]   PHYSICIAN:  [name], M.D.DATE OF BIRTH:  October 29, 1940   DATE OF PROCEDURE:  05/14/2005  DATE OF DISCHARGE:                                 OPERATIVE REPORT   PREOPERATIVE DIAGNOSIS:  Infected left total knee replacement.   POSTOPERATIVE DIAGNOSIS:  Infected left total knee replacement.   PROCEDURE:  Removal of left total knee replacement, synovectomy and  insertion of antibiotic spacer.   SURGEON:  [name], M.D.   ASSISTANT:  [name], P.A.   ANESTHESIA:  [name] orotracheal with supplemental femoral nerve block.   COMPLICATIONS:  None.   PROCEDURE:  [name] was placed under [name] orotracheal anesthesia  without difficulty.  He did receive a preoperative left femoral nerve block  after he was placed under the anesthesia.  A thigh tourniquet was then  applied to the left lower extremity.  The leg was then elevated, prepped  with Betadine scrub and then DuraPrep from the tourniquet to the ankle.  Sterile draping was performed.  With the extremity still elevated, it was  Esmarch-exsanguinated with the proximal tourniquet at 350 mmHg.   The previous anterior longitudinal incision was utilized.  It was  elliptically excised from its superior to inferior extent.  Then by sharp  dissection, the incision was carried down through the subcutaneous tissue to  the first layer of capsule.  The previous medial parapatellar incision was  identified by the Ethibond suture, and these were sequentially incised and  then removed.  The joint was then entered.  There was a cloudy yellow joint  effusion, which was sent for aerobic and anaerobic culture.  We did do a  stat Gram stain without evidence of organisms.  The patella was then everted  180 degrees laterally, the knee flexed to 90  degrees.  There was a moderate  amount of chronic-appearing synovitis.  A radical synovectomy was performed.  There was some synovial tissue beneath the patella, and simply by debriding  the synovium, the patella simply fell from its cemented position with the  cement mantle intact.  There was a membrane at the bone-methacrylate level  at the patella, and this was debrided.  Specimens of synovium were sent for  culture and sensitivity.   Further synovectomy was performed in both gutters.  There was undermining of  the tibia, particularly along the medial compartment, and it was loose.  Accordingly, the stem of the polyethylene component was amputated and then  with very little further debridement, the tibia was easily removed.  We then  carefully removed all of the methacrylate, including that within the stem,  without difficulty.  I did not see any appreciable bone loss.   Further debridement of synovium was performed in the posterior aspect of the  medial and lateral compartments.  There was some undermining about the  medial and lateral femoral condyle.  These were debrided.  Then using a  thin,  flexible osteotome, the femoral component was easily removed, little  if any bone on the methacrylate, so in essence there was very little bone  loss in both the tibia as well as the femur, and essentially none on the  patella.  A very careful, fastidious synovectomy was performed in all the  compartments without evidence of any remaining obviously infected tissue.  Cultures were taken of the deep synovial tissue for both aerobic and  anaerobic cultures.   At that point the wound was copiously irrigated with saline solution.  We  elected to insert antibiotic spacers using 2.4 mg of Tobramycin, 1 g of  vancomycin.  This was mixed into the glue and then as it became doughy, we  inserted a 12.5 mm polyethylene tibial tray on the methacrylate, placed it  in the tibia, and then fashioned a femur  with the remaining methacrylate.  As it became doughy, we applied it to the femur.  We placed the knee in  extension so that the methacrylate in the femur would conform to the tibial  tray.  After complete maturation, the joint was further inspected.  There  was a fair amount of opening with varus and valgus stress, so we elected to  insert a larger polyethylene tray measuring 20 mm, and this provided  excellent lateral stability.  There was still some opening medially, but it  was not nearly as sloppy.  The tourniquet was deflated, bleeders were Bovie  coagulated.  A Hemovac was inserted.  The deep capsule was closed with #1  Vicryl, the superficial capsule closed with interrupted 0 Vicryl, and then  the subcu with 2-0 Vicryl, the skin closed with skin clips.  A sterile bulky  dressing was applied, followed by an Ace bandage.  There was excellent  capillary refill to the foot and the incision.   The patient tolerated the procedure without complications.      [name], M.D.  Electronically Signed     PWW/MEDQ  D:  05/14/2005  T:  05/15/2005  Job:  [id number]

## 2011-06-22 ENCOUNTER — Other Ambulatory Visit: Payer: Self-pay | Admitting: Internal Medicine

## 2011-07-31 ENCOUNTER — Telehealth: Payer: Self-pay | Admitting: Internal Medicine

## 2011-07-31 NOTE — Telephone Encounter (Signed)
Patient last seen on 06/08/2010. No pending appointments. Albuterol Sulfate 4 mg last filled on 06/03/2011 for #60, take 1 by mouth twice daily. Please advise on sending refills.

## 2011-07-31 NOTE — Telephone Encounter (Signed)
Ok to refill x now plus 5 more. Please ask him to schedule ROV to keep chart open.

## 2011-07-31 NOTE — Telephone Encounter (Signed)
LMOM for pt to call back so I can schedule OV with CY.  Once I speak with pt, will go ahead and send rx to pharmacy.

## 2011-08-01 NOTE — Telephone Encounter (Signed)
LMOMTCB x 1 

## 2011-08-19 NOTE — Telephone Encounter (Signed)
LMOMTCB x 1 

## 2011-08-30 NOTE — Telephone Encounter (Signed)
Called and spoke with pt.  Pt states he already got another physician to fill this med for him.  Therefore not needing refill signed by CY.  Also ,pt states he will follow up with CY in the "spring time." Will sign off on message.

## 2011-10-04 ENCOUNTER — Telehealth: Payer: Self-pay | Admitting: *Deleted

## 2011-10-04 NOTE — Telephone Encounter (Signed)
Per CY--ok to refill---needs ov .  thanks

## 2011-10-04 NOTE — Telephone Encounter (Signed)
I spoke with pt and he states this was suppose to go to Dr. Carson Myrtle and not Dr. Maple Hudson. Pt states he will call the pharmacy and correct this and did not want rx called in. Pt states he will call in the spring to make an apt to see CDY.

## 2011-10-04 NOTE — Telephone Encounter (Signed)
Refill request received from the pharmacy for Albuterol Sulfate 4 mg tablets.  Last filled on 07/30/11 for # 60. Last Ov with CDY on 06/08/2010. There are no pending appts. Pls advise. Allergies  Allergen Reactions  . Penicillins

## 2011-12-05 ENCOUNTER — Telehealth: Payer: Self-pay | Admitting: Allergy

## 2011-12-05 NOTE — Telephone Encounter (Signed)
Ok to refill 

## 2011-12-05 NOTE — Telephone Encounter (Signed)
[facility]  ALBUTEROL SULFATE 4MG   TAKE 1 TABLET BID  # 60 Allergies  Allergen Reactions  . Penicillins    DR YOUNG Is this ok to fill

## 2011-12-06 MED ORDER — ALBUTEROL SULFATE 4 MG PO TABS: 4.0000 mg | ORAL_TABLET | Freq: Two times a day (BID) | ORAL | Status: DC

## 2013-11-02 ENCOUNTER — Telehealth: Payer: Self-pay | Admitting: Internal Medicine

## 2013-11-02 NOTE — Telephone Encounter (Signed)
Spoke with pt. He is not needing albuterol tablets refilled. According to his insurance, they will no longer cover this medication. He is wanting to have an appointment with CY before he makes any changes to his medications. New pt consult has been scheduled for 11/29/13 at 3:30pm. Wants to speak to one of his other MDs and he will call us back tomorrow. He knows that he can't wait until 11/29/13 without some kind of medication.

## 2013-11-29 ENCOUNTER — Encounter (INDEPENDENT_AMBULATORY_CARE_PROVIDER_SITE_OTHER): Payer: Self-pay

## 2013-11-29 ENCOUNTER — Institutional Professional Consult (permissible substitution): Payer: Medicare Other | Admitting: Internal Medicine

## 2013-12-03 ENCOUNTER — Telehealth: Payer: Self-pay | Admitting: Internal Medicine

## 2013-12-03 NOTE — Telephone Encounter (Signed)
Spoke with CY about patient-as he called the office and Elnita Maxwellheryl our Interior and spatial designerdirector spoke with patient. Pt explained to her that he needed an Rx sent asap for his albuterol tabs as he would be out soon. Per CY-it is okay to send short term Rx for patient and [facility] consult to re establish with CY.   LMTCB

## 2013-12-03 NOTE — Telephone Encounter (Signed)
Mr [name] called me back-states he will need a PA for his albuterol tabs not a refill; pt is aware and understands that we can not do a PA due to the fact we have not seen him in years; pt was understanding and made consult appt with CY on 12-21-13. Will discuss with CY at that time.

## 2013-12-21 ENCOUNTER — Ambulatory Visit (INDEPENDENT_AMBULATORY_CARE_PROVIDER_SITE_OTHER): Payer: Medicare Other | Admitting: Internal Medicine

## 2013-12-21 ENCOUNTER — Encounter: Payer: Self-pay | Admitting: Internal Medicine

## 2013-12-21 VITALS — BP 128/76 | HR 81 | Ht 67.75 in | Wt 256.4 lb

## 2013-12-21 DIAGNOSIS — J45909 Unspecified asthma, uncomplicated: Secondary | ICD-10-CM

## 2013-12-21 DIAGNOSIS — G4700 Insomnia, unspecified: Secondary | ICD-10-CM

## 2013-12-21 DIAGNOSIS — H6990 Unspecified Eustachian tube disorder, unspecified ear: Secondary | ICD-10-CM

## 2013-12-21 DIAGNOSIS — H6980 Other specified disorders of Eustachian tube, unspecified ear: Secondary | ICD-10-CM

## 2013-12-21 MED ORDER — TIOTROPIUM BROMIDE MONOHYDRATE 18 MCG IN CAPS: 18.0000 ug | ORAL_CAPSULE | Freq: Every day | RESPIRATORY_TRACT | Status: DC

## 2013-12-21 MED ORDER — COMPRESSOR/NEBULIZER MISC: Status: AC

## 2013-12-21 MED ORDER — COMPRESSOR/NEBULIZER MISC: Status: DC

## 2013-12-21 MED ORDER — ALBUTEROL SULFATE HFA 108 (90 BASE) MCG/ACT IN AERS: 2.0000 | INHALATION_SPRAY | Freq: Four times a day (QID) | RESPIRATORY_TRACT | Status: DC | PRN

## 2013-12-21 MED ORDER — IPRATROPIUM-ALBUTEROL 0.5-2.5 (3) MG/3ML IN SOLN: 3.0000 mL | Freq: Four times a day (QID) | RESPIRATORY_TRACT | Status: DC | PRN

## 2013-12-21 NOTE — Progress Notes (Signed)
12/21/13- 29 yoM former smoker followed for hx OSA, asthma/ bronchitis, allergic rhinitis, complicated by GERD. 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 Here to re-establish; Insurance will no longer cover Albuterol 4mg  tablets([employer]).  PCP Dr [name] LOV 06/08/10. Adult onset asthma. Remote diagnosis of obstructive sleep apnea cleared on followup study after weight loss. History of eustachian dysfunction requiring tubes. Allergic asthma, triggered in spring season. Previously successfully managed with Advair 250, albuterol tablets. He is concerned that his insurance will no longer cover albuterol tablets but he doesn't really know how he would do without them.  Prior to Admission medications   Medication Sig Start Date End Date Taking? Authorizing Provider  albuterol (VENTOLIN HFA) 108 (90 BASE) MCG/ACT inhaler Inhale 2 puffs into the lungs every 6 (six) hours as needed for wheezing or shortness of breath. 12/21/13  Yes William Budge, MD  aspirin 81 MG tablet Take 81 mg by mouth daily.   Yes Historical Provider, MD  diazepam (VALIUM) 5 MG tablet Take 5 mg by mouth daily.   Yes Historical Provider, MD  fluticasone (FLONASE) 50 MCG/ACT nasal spray Place 2 sprays into both nostrils daily.   Yes Historical Provider, MD  Fluticasone-Salmeterol (ADVAIR) 250-50 MCG/DOSE AEPB Inhale 1 puff into the lungs 2 (two) times daily.   Yes Historical Provider, MD  furosemide (LASIX) 40 MG tablet Take 40 mg by mouth as needed for fluid.   Yes Historical Provider, MD  metoprolol succinate (TOPROL-XL) 50 MG 24 hr tablet Take 25 mg by mouth 2 (two) times daily. Take with or immediately following a meal.   Yes Historical Provider, MD  Multiple Vitamin (MULTIVITAMIN) capsule Take 1 capsule by mouth daily.   Yes Historical Provider, MD  omeprazole (PRILOSEC) 20 MG capsule Take 20 mg by mouth daily.   Yes Historical Provider, MD  sildenafil (VIAGRA) 100 MG tablet Take 100 mg by mouth daily as needed for erectile  dysfunction.   Yes Historical Provider, MD  temazepam (RESTORIL) 30 MG capsule Take 30 mg by mouth at bedtime as needed for sleep.   Yes Historical Provider, MD  THEOPHYLLINE 200 MG 12 hr tablet TAKE ONE TABLET BY MOUTH TWICE DAILY 06/22/11  Yes William Budge, MD  ipratropium-albuterol (DUONEB) 0.5-2.5 (3) MG/3ML SOLN Take 3 mLs by nebulization every 6 (six) hours. DX 490, 493.90 12/22/13   William Budge, MD  Nebulizers (COMPRESSOR/NEBULIZER) MISC Use as directed 12/21/13   William Budge, MD  tiotropium (SPIRIVA) 18 MCG inhalation capsule Place 1 capsule (18 mcg total) into inhaler and inhale daily. 12/21/13   William Budge, MD  tiotropium (SPIRIVA) 18 MCG inhalation capsule Place 1 capsule (18 mcg total) into inhaler and inhale daily. 12/21/13   William Budge, MD   Past Medical History  Diagnosis Date  . Asthma   . Sleep apnea   . Allergic rhinitis    Past Surgical History  Procedure Laterality Date  . Knee surgery  2003    x 2  . Rotator cuff repair  2003/2004   Family History  Problem Relation Age of Onset  . Diabetes Father   . Heart failure Father   . Breast cancer Sister    History   Social History  . Marital Status: Married    Spouse Name: N/A    Number of Children: N/A  . Years of Education: N/A   Occupational History  . Not on file.   Social History Main Topics  . Smoking status: Former Smoker --  10 years    Types: Cigarettes  . Smokeless tobacco: Not on file  . Alcohol Use: Yes     Comment: occasional  . Drug Use: No  . Sexual Activity: Not on file   Other Topics Concern  . Not on file   Social History Narrative  . No narrative on file   ROS-see HPI Constitutional:   No-   weight loss, night sweats, fevers, chills, fatigue, lassitude. HEENT:   No-  headaches, difficulty swallowing, tooth/dental problems, sore throat,       No-  sneezing, itching, ear ache, nasal congestion, post nasal drip,  CV:  No-   chest pain, orthopnea, PND, swelling in  lower extremities, anasarca,                                                            dizziness, +chronic palpitations Resp: No-   shortness of breath with exertion or at rest.              No-   productive cough,  No non-productive cough,  No- coughing up of blood.              No-   change in color of mucus.  +rare wheezing.   Skin: No-   rash or lesions. GI:  No-   heartburn, indigestion, abdominal pain, nausea, vomiting, diarrhea,                 change in bowel habits, loss of appetite GU: No-   dysuria, change in color of urine, no urgency or frequency.  No- flank pain. MS:  No-   joint pain or swelling.  No- decreased range of motion.  No- back pain. Neuro-     nothing unusual Psych:  No- change in mood or affect. No depression or anxiety.  No memory loss.  OBJ- Physical Exam General- Alert, Oriented, Affect-appropriate, Distress- none acute Skin- rash-none, lesions- none, excoriation- none Lymphadenopathy- none Head- atraumatic            Eyes- Gross vision intact, PERRLA, conjunctivae and secretions clear            Ears- +hard of hearing            Nose- Clear, no-Septal dev, mucus, polyps, erosion, perforation             Throat- Mallampati III , mucosa clear , drainage- none, tonsils- atrophic Neck- flexible , trachea midline, no stridor , thyroid nl, carotid no bruit Chest - symmetrical excursion , unlabored           Heart/CV- RRR +with frequent extra beats , no murmur , no gallop  , no rub, nl s1 s2                           - JVD- none , edema+ trace, stasis changes- none, varices- none           Lung- clear to P&A, wheeze+ slight, unlabored, cough- none , dullness-none, rub- none           Chest wall-  Abd- tender-no, distended-no, bowel sounds-present, HSM- no Br/ Gen/ Rectal- Not done, not indicated Extrem- cyanosis- none, clubbing, none, atrophy- none, strength- nl Neuro- grossly intact to observation

## 2013-12-21 NOTE — Patient Instructions (Signed)
Sample and script to try Spiriva once daily     This will replace albuterol tabs  Order- DME- scripts for nebulizer compressor/ albuterol&ipratropium neb solution.   Script for rescue inhaler  Please call as needed

## 2013-12-22 ENCOUNTER — Telehealth: Payer: Self-pay | Admitting: Internal Medicine

## 2013-12-22 MED ORDER — IPRATROPIUM-ALBUTEROL 0.5-2.5 (3) MG/3ML IN SOLN: 3.0000 mL | Freq: Four times a day (QID) | RESPIRATORY_TRACT | Status: DC

## 2013-12-22 NOTE — Telephone Encounter (Signed)
I have reprinted corrected Rx for patient and faxed to number provided. I called and informed [facility] that I have faxed the Rx as requested.

## 2014-01-09 ENCOUNTER — Encounter: Payer: Self-pay | Admitting: Internal Medicine

## 2014-01-09 NOTE — Assessment & Plan Note (Signed)
Had failed temazepam in the past. Educated sleep hygiene

## 2014-01-09 NOTE — Assessment & Plan Note (Signed)
Hard of hearing  

## 2014-01-09 NOTE — Assessment & Plan Note (Addendum)
He is using Advair but not rescue inhalers in a long time. Plan-refill Advair, rescue albuterol HFA, try Spiriva, chest x-ray, home nebulizer with albuterol

## 2014-01-13 ENCOUNTER — Telehealth: Payer: Self-pay | Admitting: Internal Medicine

## 2014-01-13 MED ORDER — MOMETASONE FURO-FORMOTEROL FUM 200-5 MCG/ACT IN AERO: 2.0000 | INHALATION_SPRAY | Freq: Two times a day (BID) | RESPIRATORY_TRACT | Status: DC

## 2014-01-13 NOTE — Telephone Encounter (Signed)
Last OV 12-21-13. I spoke with the pt and he states he is having a few issues with Spiriva since starting it at last OV.  He states he just does not feel like it works as well as the tablets did.  He states that it is causing him to have a severe dry mouth. He states he also has needed his ventolin inhaler more often, especially at night. And the last thing he has noticed is that he has been having diarrhea since starting the spiriva. Pt wanted to let Dr. Maple HudsonYoung know this to see if there were any further recs.  Allergies  Allergen Reactions  . Penicillins     REACTION as a child-unsure what happened

## 2014-01-13 NOTE — Telephone Encounter (Signed)
Duplicate message. Jonmichael Beadnell, CMA  

## 2014-01-13 NOTE — Telephone Encounter (Signed)
Spoke with the pt and notified of recs per CDY  He verbalized understanding MAR updated and sample up front

## 2014-01-13 NOTE — Telephone Encounter (Signed)
Per OV 12/21/13: Patient Instructions      Sample and script to try Spiriva once daily     This will replace albuterol tabs Order- DME- scripts for nebulizer compressor/ albuterol&ipratropium neb solution.  Script for rescue inhaler Please call as needed  --  atc home # pt line rang several times.  Called mobile LMTCB x1

## 2014-01-13 NOTE — Telephone Encounter (Signed)
Suggest we stop Spiriva and try replacing Advair 250 for now with a sample of Dulera 200- 2 puffs, then rinse mouth well, twice daily

## 2014-02-07 ENCOUNTER — Telehealth: Payer: Self-pay | Admitting: Internal Medicine

## 2014-02-07 MED ORDER — MOMETASONE FURO-FORMOTEROL FUM 200-5 MCG/ACT IN AERO: 2.0000 | INHALATION_SPRAY | Freq: Two times a day (BID) | RESPIRATORY_TRACT | Status: DC

## 2014-02-07 NOTE — Telephone Encounter (Signed)
Spoke with the pt to verify the msg [facility] for [facility]  Pt states nothing further needed

## 2014-02-15 ENCOUNTER — Telehealth: Payer: Self-pay | Admitting: Internal Medicine

## 2014-02-15 NOTE — Telephone Encounter (Signed)
I called pharmacy and received PA of [phone] and initiated PA for [facility]. Will await fax to triage. ATC to adivse the pt, NA, no voicemail. Carron Curie.Jennifer Castillo, CMA

## 2014-02-15 NOTE — Telephone Encounter (Signed)
Received an approval for [facility]. Pt is aware. Carron CurieJennifer Castillo, CMA

## 2014-02-15 NOTE — Telephone Encounter (Signed)
Form completed and fxed to [facility] at [phone]. Will forward to [facility] to follow-up on approval/denial.Ora Mcnatt Yancey FlemingsCastillo, CMA

## 2014-02-16 ENCOUNTER — Telehealth: Payer: Self-pay | Admitting: Internal Medicine

## 2014-02-16 NOTE — Telephone Encounter (Signed)
$  100/month for Dulera; pt's insurance states there are no cheaper alternatives. [facility] customer service [phone]. ID# is [id number].  Pt is aware that I will call his insurance company to see what can be given; in the meantime patient has Dulera on hand to use. I will call patient with update as I get information.

## 2014-02-21 MED ORDER — BECLOMETHASONE DIPROPIONATE 80 MCG/ACT IN AERS: 2.0000 | INHALATION_SPRAY | Freq: Two times a day (BID) | RESPIRATORY_TRACT | Status: DC

## 2014-02-21 NOTE — Telephone Encounter (Signed)
Called spoke with pt. Aware of recs. He already has albuterol. Qvar haws been sent.nothing further needed

## 2014-02-21 NOTE — Telephone Encounter (Signed)
Instead of Dulera/ Symbicort/Advair  offer Qvar 80, 2puffs, then rinse mouth, twice daily  # 1, refill prn          Albuterol rescue inhaler 2 puffs every 4 hours as needed, # 1, ref prn

## 2014-02-21 NOTE — Telephone Encounter (Signed)
FYI: pt just had Dulera PA approved on 02-15-14-but cost too much.

## 2014-02-21 NOTE — Telephone Encounter (Signed)
Called patient's Insurance at number provided; Spoke with Jennifer-she states that she is not showing any alternatives to [facility] inhaler. She needed to transfer me to [facility] [phone] to speak with them about alternatives. The rep with [facility] states that Symbicort is Tier 3 for patient.   Pt has tried Advair in the past. The only option we would have to help patient out is to do a tier exception for [facility] if you do not want to give Symbicort.   CY, please advise. Thanks.

## 2014-07-28 ENCOUNTER — Other Ambulatory Visit: Payer: Self-pay | Admitting: Internal Medicine

## 2014-07-28 MED ORDER — ALBUTEROL SULFATE HFA 108 (90 BASE) MCG/ACT IN AERS: 2.0000 | INHALATION_SPRAY | RESPIRATORY_TRACT | Status: DC | PRN

## 2014-07-28 NOTE — Telephone Encounter (Signed)
Rx sent per Katie's request Will sign off

## 2014-09-06 ENCOUNTER — Telehealth: Payer: Self-pay | Admitting: Internal Medicine

## 2014-09-06 MED ORDER — FLUTICASONE PROPIONATE HFA 110 MCG/ACT IN AERO: 2.0000 | INHALATION_SPRAY | Freq: Two times a day (BID) | RESPIRATORY_TRACT | Status: DC

## 2014-09-06 NOTE — Telephone Encounter (Signed)
We received a fax from pt's insurance. They will no longer cover Qvar. Alternative drug is Flovent. Per CY - okay to change.  Lmtcb x1.

## 2014-09-06 NOTE — Telephone Encounter (Signed)
Flovent is a maintenance cortisone anti-inflammatory inhaler, like Qvar. This is quite different from the active bronchodilator albuterol tablets. The steroid inhalers are considered important for long term airway stability, but they are boring because you can't really feel them work. Suggest he try Flovent 110, # 1, 2 puffs then rinse mouth, twice daily for maintenance, for 1 month. Refill prn. Looking to reduce need for frequent bronchodilator use.

## 2014-09-06 NOTE — Telephone Encounter (Signed)
Patient says that Qvar did not work well for him anyway.  He said the only thing that helped was the Albuterol Sulfate tablets but he cannot afford it anymore.  Patient says that he is willing to try the Flovent if Dr. Maple HudsonYoung thinks he should.  He said he went back to using the Advair and it helped some, but still not as well as the Albuterol Sulfate tablets.  Pt says ok to send Flovent if Dr. Maple HudsonYoung thinks it will work.  Pt would like call back after it has been sent.

## 2014-09-06 NOTE — Telephone Encounter (Signed)
Called and spoke to pt. Informed pt of the recs per CY. Rx sent to pharmacy. Pt verbalized understanding and denied any further questions or concerns at this time.  

## 2015-01-09 ENCOUNTER — Ambulatory Visit: Payer: Medicare Other | Admitting: Internal Medicine

## 2015-01-12 ENCOUNTER — Encounter: Payer: Self-pay | Admitting: Internal Medicine

## 2015-01-12 ENCOUNTER — Ambulatory Visit (INDEPENDENT_AMBULATORY_CARE_PROVIDER_SITE_OTHER): Payer: Medicare Other | Admitting: Internal Medicine

## 2015-01-12 VITALS — BP 136/68 | HR 73 | Ht 67.75 in | Wt 261.0 lb

## 2015-01-12 DIAGNOSIS — H6983 Other specified disorders of Eustachian tube, bilateral: Secondary | ICD-10-CM

## 2015-01-12 DIAGNOSIS — J4540 Moderate persistent asthma, uncomplicated: Secondary | ICD-10-CM

## 2015-01-12 MED ORDER — FLUTICASONE-SALMETEROL 250-50 MCG/DOSE IN AEPB: INHALATION_SPRAY | RESPIRATORY_TRACT | Status: DC

## 2015-01-12 MED ORDER — ALBUTEROL SULFATE ER 4 MG PO TB12: ORAL_TABLET | ORAL | Status: DC

## 2015-01-12 NOTE — Patient Instructions (Addendum)
Sample Dymista nasal spray  1-2 puffs each nostril once daily at bedtime  Script printed for albuterol tabs  Please call as needed

## 2015-01-12 NOTE — Progress Notes (Signed)
12/21/13- [age] yoM former smoker followed for hx OSA, asthma/ bronchitis, allergic rhinitis, complicated by GERD. 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 Here to re-establish; Insurance will no longer cover Albuterol 4mg  tablets([employer]).  PCP Dr Watt ClimesEscajeda LOV 06/08/10. Adult onset asthma. Remote diagnosis of obstructive sleep apnea cleared on followup study after weight loss. History of eustachian dysfunction requiring tubes. Allergic asthma, triggered in spring season. Previously successfully managed with Advair 250, albuterol tablets. He is concerned that his insurance will no longer cover albuterol tablets but he doesn't really know how he would do without them.  01/12/15- [age] yoM former smoker followed for hx OSA, asthma/ bronchitis, allergic rhinitis, complicated by GERD. Retired Freight forwarderdisc jockey. [place] FOLLOWS FOR: Pt's Elwin Sleightdulera is not covered by his ins, and dulera does not work for pt. Pt currently on advair and albuterol tabs.  [facility] He insists they work better for him than anything else and are well-tolerated. He also prefers Advair as a product. Complains of a fluid drainage feeling in his eustachian tubes without sinus pain or earache.  ROS-see HPI Constitutional:   No-   weight loss, night sweats, fevers, chills, fatigue, lassitude. HEENT:   No-  headaches, difficulty swallowing, tooth/dental problems, sore throat,       No-  sneezing, itching, ear ache, nasal congestion, post nasal drip,  CV:  No-   chest pain, orthopnea, PND, swelling in lower extremities, anasarca,                                                            dizziness,  palpitations Resp: No-   shortness of breath with exertion or at rest.              No-   productive cough,  No non-productive cough,  No- coughing up of blood.              No-   change in color of mucus.  +rare wheezing.   Skin: No-   rash or lesions. GI:   GU: . MS:  No-   joint pain  or swelling.   Neuro-     nothing unusual Psych:  No- change in mood or affect. No depression or anxiety.  No memory loss.  OBJ- Physical Exam General- Alert, Oriented, Affect-appropriate, Distress- none acute Skin- rash-none, lesions- none, excoriation- none Lymphadenopathy- none Head- atraumatic            Eyes- Gross vision intact, PERRLA, conjunctivae and secretions clear            Ears- +hard of hearing, hearing aids            Nose- Clear, no-Septal dev, mucus, polyps, erosion, perforation             Throat- Mallampati III , mucosa clear , drainage- none, tonsils- atrophic Neck- flexible , trachea midline, no stridor , thyroid nl, carotid no bruit Chest - symmetrical excursion , unlabored           Heart/CV- RRR , no murmur , no gallop  , no rub, nl s1 s2                           -  JVD- none , edema+ trace, stasis changes- none, varices- none           Lung- clear to P&A, wheeze-none, unlabored, cough- none , dullness-none, rub- none           Chest wall-  Abd-  Br/ Gen/ Rectal- Not done, not indicated Extrem- cyanosis- none, clubbing, none, atrophy- none, strength- nl Neuro- grossly intact to observation

## 2015-01-13 ENCOUNTER — Telehealth: Payer: Self-pay | Admitting: Internal Medicine

## 2015-01-13 MED ORDER — ALBUTEROL SULFATE 4 MG PO TABS: 4.0000 mg | ORAL_TABLET | Freq: Two times a day (BID) | ORAL | Status: DC

## 2015-01-13 NOTE — Assessment & Plan Note (Signed)
He has had unusual medical preferences including strong choice to use albuterol tablets. We have worked through options and alternatives repeatedly.

## 2015-01-13 NOTE — Telephone Encounter (Signed)
Prescription was written for Albuterol Sulfate (extended version).  Patient wants Albuterol Sulfate 4mg  tablets, twice daily.  Patient says he has been taking these for 25 years and [facility]  [facility] [address], [place]  Ok to send prescription for Albuterol Sulfate 4mg  tablets 90 day supply?  Current Outpatient Prescriptions on File Prior to Visit  Medication Sig Dispense Refill  . albuterol (VENTOLIN HFA) 108 (90 BASE) MCG/ACT inhaler Inhale 2 puffs into the lungs every 4 (four) hours as needed for wheezing or shortness of breath. 18 g 11  . albuterol (VOSPIRE ER) 4 MG 12 hr tablet 1 tab twice daily 180 tablet 4  . aspirin 81 MG tablet Take 81 mg by mouth daily.    . diazepam (VALIUM) 5 MG tablet Take 5 mg by mouth daily.    . fluticasone (FLONASE) 50 MCG/ACT nasal spray Place 2 sprays into both nostrils daily.    . Fluticasone-Salmeterol (ADVAIR DISKUS) 250-50 MCG/DOSE AEPB 1 puff then rinse mouth, twice daily maintenance 60 each 5  . furosemide (LASIX) 40 MG tablet Take 40 mg by mouth as needed for fluid.    Marland Kitchen. ipratropium-albuterol (DUONEB) 0.5-2.5 (3) MG/3ML SOLN Take 3 mLs by nebulization every 6 (six) hours. DX 490, 493.90 75 mL prn  . metoprolol succinate (TOPROL-XL) 50 MG 24 hr tablet Take 25 mg by mouth 2 (two) times daily. Take with or immediately following a meal.    . Multiple Vitamin (MULTIVITAMIN) capsule Take 1 capsule by mouth daily.    . Nebulizers (COMPRESSOR/NEBULIZER) MISC Use as directed 1 each prn  . omeprazole (PRILOSEC) 20 MG capsule Take 20 mg by mouth daily.    . sildenafil (VIAGRA) 100 MG tablet Take 100 mg by mouth daily as needed for erectile dysfunction.    . temazepam (RESTORIL) 30 MG capsule Take 30 mg by mouth at bedtime as needed for sleep.    . THEOPHYLLINE 200 MG 12 hr tablet TAKE ONE TABLET BY MOUTH TWICE DAILY 60 tablet 0   No current facility-administered medications on file prior to visit.    Allergies  Allergen Reactions  . Penicillins     REACTION as a child-unsure what happened

## 2015-01-13 NOTE — Assessment & Plan Note (Signed)
Complaints of of draining fluid sensation began around the time that Spring pollens climbed Plan-sample Dymista nasal spray

## 2015-01-13 NOTE — Telephone Encounter (Signed)
rx sent. Patient notified.  Nothing further needed. 

## 2015-01-13 NOTE — Telephone Encounter (Signed)
Yes please correct his albuterol tablet script thanks

## 2016-01-12 ENCOUNTER — Ambulatory Visit: Payer: Medicare Other | Admitting: Internal Medicine

## 2016-03-22 ENCOUNTER — Encounter: Payer: Self-pay | Admitting: Internal Medicine

## 2016-03-22 ENCOUNTER — Ambulatory Visit (INDEPENDENT_AMBULATORY_CARE_PROVIDER_SITE_OTHER): Payer: Medicare Other | Admitting: Internal Medicine

## 2016-03-22 ENCOUNTER — Ambulatory Visit (INDEPENDENT_AMBULATORY_CARE_PROVIDER_SITE_OTHER)
Admission: RE | Admit: 2016-03-22 | Discharge: 2016-03-22 | Disposition: A | Discharge status: Home / Self Care | Payer: Medicare Other | Source: Ambulatory Visit | Attending: Internal Medicine | Admitting: Internal Medicine

## 2016-03-22 VITALS — BP 126/72 | HR 87 | Ht 67.75 in | Wt 256.8 lb

## 2016-03-22 DIAGNOSIS — J45909 Unspecified asthma, uncomplicated: Secondary | ICD-10-CM

## 2016-03-22 DIAGNOSIS — G4733 Obstructive sleep apnea (adult) (pediatric): Secondary | ICD-10-CM

## 2016-03-22 DIAGNOSIS — J4540 Moderate persistent asthma, uncomplicated: Secondary | ICD-10-CM | POA: Diagnosis not present

## 2016-03-22 DIAGNOSIS — I493 Ventricular premature depolarization: Secondary | ICD-10-CM | POA: Diagnosis not present

## 2016-03-22 DIAGNOSIS — K219 Gastro-esophageal reflux disease without esophagitis: Secondary | ICD-10-CM

## 2016-03-22 MED ORDER — THEOPHYLLINE ER 300 MG PO TB12: 300.0000 mg | ORAL_TABLET | Freq: Two times a day (BID) | ORAL | Status: DC

## 2016-03-22 MED ORDER — ALBUTEROL SULFATE 4 MG PO TABS: 4.0000 mg | ORAL_TABLET | Freq: Two times a day (BID) | ORAL | Status: DC

## 2016-03-22 MED ORDER — ALBUTEROL SULFATE HFA 108 (90 BASE) MCG/ACT IN AERS: 2.0000 | INHALATION_SPRAY | RESPIRATORY_TRACT | Status: AC | PRN

## 2016-03-22 MED ORDER — IPRATROPIUM-ALBUTEROL 0.5-2.5 (3) MG/3ML IN SOLN: 3.0000 mL | Freq: Four times a day (QID) | RESPIRATORY_TRACT | Status: DC

## 2016-03-22 MED ORDER — FLUTICASONE-SALMETEROL 250-50 MCG/DOSE IN AEPB: INHALATION_SPRAY | RESPIRATORY_TRACT | Status: DC

## 2016-03-22 MED ORDER — TEMAZEPAM 30 MG PO CAPS: 30.0000 mg | ORAL_CAPSULE | Freq: Every evening | ORAL | Status: DC | PRN

## 2016-03-22 NOTE — Assessment & Plan Note (Signed)
Occasional skipped beat on examination consistent with PVCs.

## 2016-03-22 NOTE — Assessment & Plan Note (Signed)
He is encouraged to maintain weight loss since this resolved OSA in the past.

## 2016-03-22 NOTE — Assessment & Plan Note (Signed)
Restriction of FVC on office spirometry is consistent with his abdominal obesity and does not necessarily imply air-trapping. He has moderate slowing of airflow and will need a formal PFT eventually to confirm lung volumes and diffusion capacity before a definite diagnosis of COPD. He has continued strong preference for older medications-albuterol tablets, theophylline. He has tolerated these well and okay to continue. Plan-CXR

## 2016-03-22 NOTE — Patient Instructions (Signed)
Med refills sent  Order- office spirometry    Dx asthma  Order- CXR  Dx asthma, hx LLL pneumonia  Please call as needed

## 2016-03-22 NOTE — Progress Notes (Signed)
12/21/13- [age] yoM former smoker followed for hx OSA, asthma/ bronchitis, allergic rhinitis, complicated by GERD. 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 Here to re-establish; Insurance will no longer cover Albuterol 4mg  tablets([employer]).  PCP Dr Watt ClimesEscajeda LOV 06/08/10. Adult onset asthma. Remote diagnosis of obstructive sleep apnea cleared on followup study after weight loss. History of eustachian dysfunction requiring tubes. Allergic asthma, triggered in spring season. Previously successfully managed with Advair 250, albuterol tablets. He is concerned that his insurance will no longer cover albuterol tablets but he doesn't really know how he would do without them.  01/12/15- 89 yoM former smoker followed for hx OSA, asthma/ bronchitis, allergic rhinitis, complicated by GERD. Retired Freight forwarderdisc jockey. [place] FOLLOWS FOR: Pt's Elwin Sleightdulera is not covered by his ins, and dulera does not work for pt. Pt currently on advair and albuterol tabs.  He thought he could [facility]. He insists they work better for him than anything else and are well-tolerated. He also prefers Advair as a product. Complains of a fluid drainage feeling in his eustachian tubes without sinus pain or earache.  03/22/2016-75 year old male former smoker followed for asthma/bronchitis, allergic rhinitis, complicated by prior history OSA, GERD FOLLOWS FOR:Pt states he has noticed that he is having more SOB than usual-normally happens with activity. Checks his O2 levels and they are staying up. 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 PND at night as well. Early Dec 2016 patient had PNA and took to the middle of Jan for MD's to figure out that he had it due to PCP office changing over systems/companies. CXR 11/24/2015-Care Everywhere-worsening left basilar opacification-likely atelectasis Tre[facility]. Feels that resolved. Now some bothersome postnasal drip and much cough at night-clear mucus. He continues to feel strongly that occasional use of theophylline and albuterol tablets make a real difference for him and he wants to keep him available. Theophylline was increased to 300 mg twice daily because of availability of that dose form, at the [facility]. He notices dyspnea on exertion with hills and stairs, not really changed. Office Spirometry 03/22/2016-moderate restriction of exhaled volume and moderate obstruction. FVC 2.48/64%, FEV1 1.61/58%, ratio 0.65, FEF 25-75 percent 0.89/44%.  ROS-see HPI Constitutional:   No-   weight loss, night sweats, fevers, chills, fatigue, lassitude. HEENT:   No-  headaches, difficulty swallowing, tooth/dental problems, sore throat,       No-  sneezing, itching, ear ache, nasal congestion, post nasal drip,  CV:  No-   chest pain, orthopnea, PND, swelling in lower extremities, anasarca,                                                                                  dizziness,  palpitations Resp: + shortness of breath with exertion or at rest.              No-   productive cough,  No non-productive cough,  No- coughing up of blood.  No-   change in color of mucus.  +rare wheezing.   Skin: No-   rash or lesions. GI:   GU: . MS:  No-   joint pain or swelling.   Neuro-     nothing unusual Psych:  No- change in mood or affect. No depression or anxiety.  No memory loss.  OBJ- Physical Exam General- Alert, Oriented, Affect-appropriate, Distress- none acute, + obese Skin- rash-none, lesions- none, excoriation- none Lymphadenopathy- none Head- atraumatic            Eyes- Gross vision intact, PERRLA, conjunctivae and secretions clear            Ears- +hard of hearing, hearing aids            Nose- Clear, no-Septal dev, mucus, polyps, erosion, perforation             Throat- Mallampati  III , mucosa clear , drainage- none, tonsils- atrophic Neck- flexible , trachea midline, no stridor , thyroid nl, carotid no bruit Chest - symmetrical excursion , unlabored           Heart/CV- RRR + occasional skipped beat , no murmur , no gallop  , no rub, nl s1 s2                           - JVD- none , edema+ trace, stasis changes- none, varices- none           Lung- clear to P&A, wheeze-none, unlabored, cough- none , dullness-none, rub- none           Chest wall-  Abd-  Br/ Gen/ Rectal- Not done, not indicated Extrem- cyanosis- none, clubbing, none, atrophy- none, strength- nl, + bilateral knee replacement scars Neuro- grossly intact to observation

## 2016-03-22 NOTE — Assessment & Plan Note (Signed)
Some nighttime cough but no awareness of reflux or heartburn. Watch for this on theophylline.

## 2016-07-05 ENCOUNTER — Telehealth: Payer: Self-pay | Admitting: Internal Medicine

## 2016-07-05 DIAGNOSIS — J449 Chronic obstructive pulmonary disease, unspecified: Secondary | ICD-10-CM

## 2016-07-05 MED ORDER — LEVALBUTEROL HCL 0.63 MG/3ML IN NEBU: 0.6300 mg | INHALATION_SOLUTION | Freq: Four times a day (QID) | RESPIRATORY_TRACT | 12 refills | Status: DC

## 2016-07-05 NOTE — Telephone Encounter (Signed)
Spoke with pt, aware of recs.  xopenex sent to preferred pharmacy.  Pt states he is unsure if he wants to continue theophylline until he sees CY as he states the theophylline is what caused him to go into afib.  Pt states he is unable to schedule a ROV at this time, states he will call back on Monday to schedule rov.   Feno will have to be done in an office visit.   Labs have been ordered, and pft has been ordered also. Will await call back from pt to schedule rov.

## 2016-07-05 NOTE — Telephone Encounter (Signed)
Would like to change his nebulizer med from Duoneb to levalbuterol  0.63, # 120 ampules, 1 every 6 hours, refill x 12  Suggest until he can get the new neb solution, reduce theophylline from twice daily to once daily as a compromise.   When he can come by- need to order  CBC w diff, IgE,  And do a FENO and schedule a PFT for dx  COPD mixed type

## 2016-07-05 NOTE — Telephone Encounter (Signed)
Called spoke with pt. He reports he was told to stop theophyline d/t AFIB. It was stopped by Dr. Onalee HuaAlvarez. Was told AFIB was a side effect of this. Pt reports he stopped this 2 days ago. This morning he woke up with chest tightness and felt very SOB. Wants to know what needs to be done/any recs? Please advise Dr. Maple HudsonYoung thanks  Allergies  Allergen Reactions  . Penicillins     REACTION as a child-unsure what happened     Current Outpatient Prescriptions on File Prior to Visit  Medication Sig Dispense Refill  . albuterol (PROVENTIL) 4 MG tablet Take 1 tablet (4 mg total) by mouth 2 (two) times daily. 180 tablet 12  . albuterol (VENTOLIN HFA) 108 (90 Base) MCG/ACT inhaler Inhale 2 puffs into the lungs every 4 (four) hours as needed for wheezing or shortness of breath. 18 g 11  . aspirin 81 MG tablet Take 81 mg by mouth daily.    . diazepam (VALIUM) 5 MG tablet Take 5 mg by mouth daily.    . fluticasone (FLONASE) 50 MCG/ACT nasal spray Place 2 sprays into both nostrils daily.    . Fluticasone-Salmeterol (ADVAIR DISKUS) 250-50 MCG/DOSE AEPB 1 puff then rinse mouth, twice daily maintenance 60 each 12  . furosemide (LASIX) 40 MG tablet Take 40 mg by mouth as needed for fluid.    Marland Kitchen. ipratropium-albuterol (DUONEB) 0.5-2.5 (3) MG/3ML SOLN Take 3 mLs by nebulization every 6 (six) hours. DX 490, 493.90 75 mL prn  . metoprolol succinate (TOPROL-XL) 50 MG 24 hr tablet Take 25 mg by mouth 2 (two) times daily. Take with or immediately following a meal.    . Multiple Vitamin (MULTIVITAMIN) capsule Take 1 capsule by mouth daily.    . Nebulizers (COMPRESSOR/NEBULIZER) MISC Use as directed 1 each prn  . omeprazole (PRILOSEC) 20 MG capsule Take 20 mg by mouth daily.    . sildenafil (VIAGRA) 100 MG tablet Take 100 mg by mouth daily as needed for erectile dysfunction.    . temazepam (RESTORIL) 30 MG capsule Take 1 capsule (30 mg total) by mouth at bedtime as needed for sleep. 30 capsule 5  . theophylline (THEODUR)  300 MG 12 hr tablet Take 1 tablet (300 mg total) by mouth 2 (two) times daily. 60 tablet 12   No current facility-administered medications on file prior to visit.

## 2016-07-11 ENCOUNTER — Ambulatory Visit (HOSPITAL_COMMUNITY)
Admission: RE | Admit: 2016-07-11 | Discharge: 2016-07-11 | Disposition: A | Discharge status: Home / Self Care | Payer: Medicare Other | Source: Ambulatory Visit | Attending: Internal Medicine | Admitting: Internal Medicine

## 2016-07-11 DIAGNOSIS — J449 Chronic obstructive pulmonary disease, unspecified: Secondary | ICD-10-CM

## 2016-07-11 LAB — PULMONARY FUNCTION TEST
DL/VA % pred: 107 %
DL/VA: 4.55 ml/min/mmHg/L
DLCO unc % pred: 88 %
DLCO unc: 22.76 ml/min/mmHg
FEF 25-75 Post: 1.03 L/sec
FEF 25-75 Pre: 0.66 L/sec
FEF2575-%Change-Post: 57 %
FEF2575-%Pred-Post: 60 %
FEF2575-%Pred-Pre: 38 %
FEV1-%CHANGE-POST: 18 %
FEV1-%PRED-PRE: 59 %
FEV1-%Pred-Post: 70 %
FEV1-POST: 1.69 L
FEV1-PRE: 1.42 L
FEV1FVC-%Change-Post: 13 %
FEV1FVC-%Pred-Pre: 81 %
FEV6-%Change-Post: 6 %
FEV6-%PRED-PRE: 73 %
FEV6-%Pred-Post: 77 %
FEV6-POST: 2.44 L
FEV6-Pre: 2.3 L
FEV6FVC-%Change-Post: 1 %
FEV6FVC-%PRED-POST: 103 %
FEV6FVC-%Pred-Pre: 101 %
FVC-%Change-Post: 4 %
FVC-%PRED-PRE: 72 %
FVC-%Pred-Post: 75 %
FVC-POST: 2.54 L
FVC-PRE: 2.43 L
POST FEV6/FVC RATIO: 96 %
PRE FEV6/FVC RATIO: 95 %
Post FEV1/FVC ratio: 66 %
Pre FEV1/FVC ratio: 59 %
RV % pred: 111 %
RV: 2.59 L
TLC % PRED: 104 %
TLC: 6.29 L

## 2016-07-11 MED ORDER — ALBUTEROL SULFATE (2.5 MG/3ML) 0.083% IN NEBU
2.5000 mg | INHALATION_SOLUTION | Freq: Once | RESPIRATORY_TRACT | Status: AC
  Administered 2016-07-11: 2.5 mg via RESPIRATORY_TRACT

## 2016-12-09 ENCOUNTER — Other Ambulatory Visit: Payer: Self-pay | Admitting: Internal Medicine

## 2016-12-09 MED ORDER — TEMAZEPAM 30 MG PO CAPS: 30.0000 mg | ORAL_CAPSULE | Freq: Every evening | ORAL | 5 refills | Status: DC | PRN

## 2016-12-09 NOTE — Telephone Encounter (Signed)
Per CY-okay to refill this time plus 5 additional refills. Rx has been called to pharmacy.

## 2017-04-03 ENCOUNTER — Other Ambulatory Visit: Payer: Self-pay | Admitting: Internal Medicine

## 2017-04-10 ENCOUNTER — Ambulatory Visit: Payer: Medicare Other | Admitting: Internal Medicine

## 2017-04-11 ENCOUNTER — Encounter: Payer: Self-pay | Admitting: Internal Medicine

## 2017-04-11 ENCOUNTER — Ambulatory Visit (INDEPENDENT_AMBULATORY_CARE_PROVIDER_SITE_OTHER): Payer: Medicare Other | Admitting: Internal Medicine

## 2017-04-11 VITALS — BP 128/80 | HR 86 | Ht 67.75 in | Wt 257.6 lb

## 2017-04-11 DIAGNOSIS — J4540 Moderate persistent asthma, uncomplicated: Secondary | ICD-10-CM | POA: Diagnosis not present

## 2017-04-11 DIAGNOSIS — I482 Chronic atrial fibrillation, unspecified: Secondary | ICD-10-CM

## 2017-04-11 MED ORDER — UMECLIDINIUM BROMIDE 62.5 MCG/INH IN AEPB: 1.0000 | INHALATION_SPRAY | Freq: Every day | RESPIRATORY_TRACT | 12 refills | Status: DC

## 2017-04-11 MED ORDER — UMECLIDINIUM BROMIDE 62.5 MCG/INH IN AEPB: 1.0000 | INHALATION_SPRAY | Freq: Every day | RESPIRATORY_TRACT | 0 refills | Status: DC

## 2017-04-11 NOTE — Patient Instructions (Addendum)
Sample and script to add Incruse  Ellipta       Inhale 1 puff, once daily maintenance  Ok to try sticking with the levalbuterol neb solution up to every 6 hours if needed  Ok to continue Advair 250 maintenance controller  Order- [facility]- replacement nebulizer machine, hose and supplies,    Dx asthma moderate persistent  Please call as needed

## 2017-04-11 NOTE — Progress Notes (Signed)
HPI male former smoker followed for asthma/bronchitis, allergic rhinitis, complicated by prior history OSA, GERD, AFib,  Office Spirometry 03/22/2016-moderate restriction of exhaled volume and moderate obstruction. FVC 2.48/64%, FEV1 1.61/58%, ratio 0.65, FEF 25-75 percent 0.89/44%. PFT 07/11/16-moderate obstructive disease with response to bronchodilator. FVC 2.54/75%, FEV1 1.69/70%, ratio 0.66, TLC 104%, DLCO 88% ---------------------------------------------------------------------------------------------------- 03/22/2016-76 year old male former smoker followed for asthma/bronchitis, allergic rhinitis, complicated by prior history OSA, GERD FOLLOWS FOR:Pt states he has noticed that he is having more SOB than usual-normally happens with activity. Checks his O2 levels and they are staying up. 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 PND at night as well. Early Dec 2016 patient had PNA and took to the middle of Jan for MD's to figure out that he had it due to PCP office changing over systems/companies. CXR 11/24/2015-Care Everywhere-worsening left basilar opacification-likely atelectasis [facility]. Feels that resolved. Now some bothersome postnasal drip and much cough at night-clear mucus. He continues to feel strongly that occasional use of theophylline and albuterol tablets make a real difference for him and he wants to keep him available. Theophylline was increased to 300 mg twice daily because of availability of that dose form, at the [facility]. He notices dyspnea on exertion with hills and stairs, not really changed. Office Spirometry 03/22/2016-moderate restriction of exhaled volume and moderate obstruction. FVC 2.48/64%, FEV1 1.61/58%, ratio 0.65, FEF 25-75 percent 0.89/44%.  04/11/17- 76 year old male former smoker followed  for asthma/bronchitis, allergic rhinitis, complicated by prior history OSA, GERD, AFib/ warfarin,  FOLLOWS FOR:Pt states he has had trouble with increased SOB. Was dx'd with Afib since last visit;stopped Theo and albuterol neb tx as he read that those meds could interfere with PAfib.  Has since then started the duoneb back with understanding from Cardiology office to not use all the time.  Patient was scheduled for appointment yesterday but arrived today instead- we were able to work- in. Using Xopenex neb solution and DuoNeb, albuterol rescue inhaler, Advair 250/50 PFT 07/11/16-moderate obstructive disease with response to bronchodilator. FVC 2.54/75%, FEV1 1.69/70%, ratio 0.66, TLC 104%, DLCO 88% He notices wheezing a couple of times a week, using rescue inhaler 0-2 times daily. He stopped theophylline and albuterol tablets because of his atrial fibrillation. We notice he is no longer on Singulair, unsure why. Nebulizer machine is worn out. CXR 03/22/16 IMPRESSION: Mild scarring left base. No edema or consolidation. Stable cardiac silhouette.  ROS-see HPI Constitutional:   No-   weight loss, night sweats, fevers, chills, fatigue, lassitude. HEENT:   No-  headaches, difficulty swallowing, tooth/dental problems, sore throat,       No-  sneezing, itching, ear ache, nasal congestion, post nasal drip,  CV:  No-   chest pain, orthopnea, PND, swelling in lower extremities, anasarca,                                                                                  dizziness,  palpitations Resp: + shortness of breath with exertion or at  rest.              No-   productive cough,  No non-productive cough,  No- coughing up of blood.              No-   change in color of mucus.  +rare wheezing.   Skin: No-   rash or lesions. GI:   GU: . MS:  No-   joint pain or swelling.   Neuro-     nothing unusual Psych:  No- change in mood or affect. No depression or anxiety.  No memory loss.  OBJ- Physical  Exam General- Alert, Oriented, Affect-appropriate, Distress- none acute, + obese Skin- rash-none, lesions- none, excoriation- none Lymphadenopathy- none Head- atraumatic            Eyes- Gross vision intact, PERRLA, conjunctivae and secretions clear            Ears- +hard of hearing, hearing aids            Nose- Clear, no-Septal dev, mucus, polyps, erosion, perforation             Throat- Mallampati III , mucosa clear , drainage- none, tonsils- atrophic Neck- flexible , trachea midline, no stridor , thyroid nl, carotid no bruit Chest - symmetrical excursion , unlabored           Heart/CV- IRR + , no murmur , no gallop  , no rub, nl s1 s2                           - JVD- none , edema+ trace, stasis changes- none, varices- none           Lung- clear to P&A, wheeze-none, unlabored, cough- none , dullness-none, rub- none           Chest wall-  Abd-  Br/ Gen/ Rectal- Not done, not indicated Extrem- cyanosis- none, clubbing, none, atrophy- none, strength- nl, + bilateral knee replacement scars Neuro- grossly intact to observation

## 2017-04-12 ENCOUNTER — Encounter: Payer: Self-pay | Admitting: Internal Medicine

## 2017-04-12 DIAGNOSIS — I482 Chronic atrial fibrillation, unspecified: Secondary | ICD-10-CM | POA: Insufficient documentation

## 2017-04-12 NOTE — Assessment & Plan Note (Signed)
He needs some extra stabilization. We discussed the use of LAMA bronchodilators because of the lack of cardiac stimulation, although his primary disorder is asthma rather than COPD. Plan-Try Incruse Ellipta, replace old nebulizer.   Consider adding back Singulair and increasing his inhaled steroid above Advair 250/50 as next steps.

## 2017-04-12 NOTE — Assessment & Plan Note (Signed)
[facility] with warfarin, diltiazem. Failed cardioversion. Would like to avoid upward pressure on his heart rate from stimulant bronchodilators if possible.

## 2017-06-08 IMAGING — DX DG CHEST 2V
2 series · 2 of 2 positions shown · non-contrast
Comparison: November 24, 2015.

ADDENDUM:
There is a healed prior fracture of the mid left clavicle with
remodeling.
CLINICAL DATA: Chronic congestion.  Asthma.

EXAM:
CHEST  2 VIEW

[chest pa]
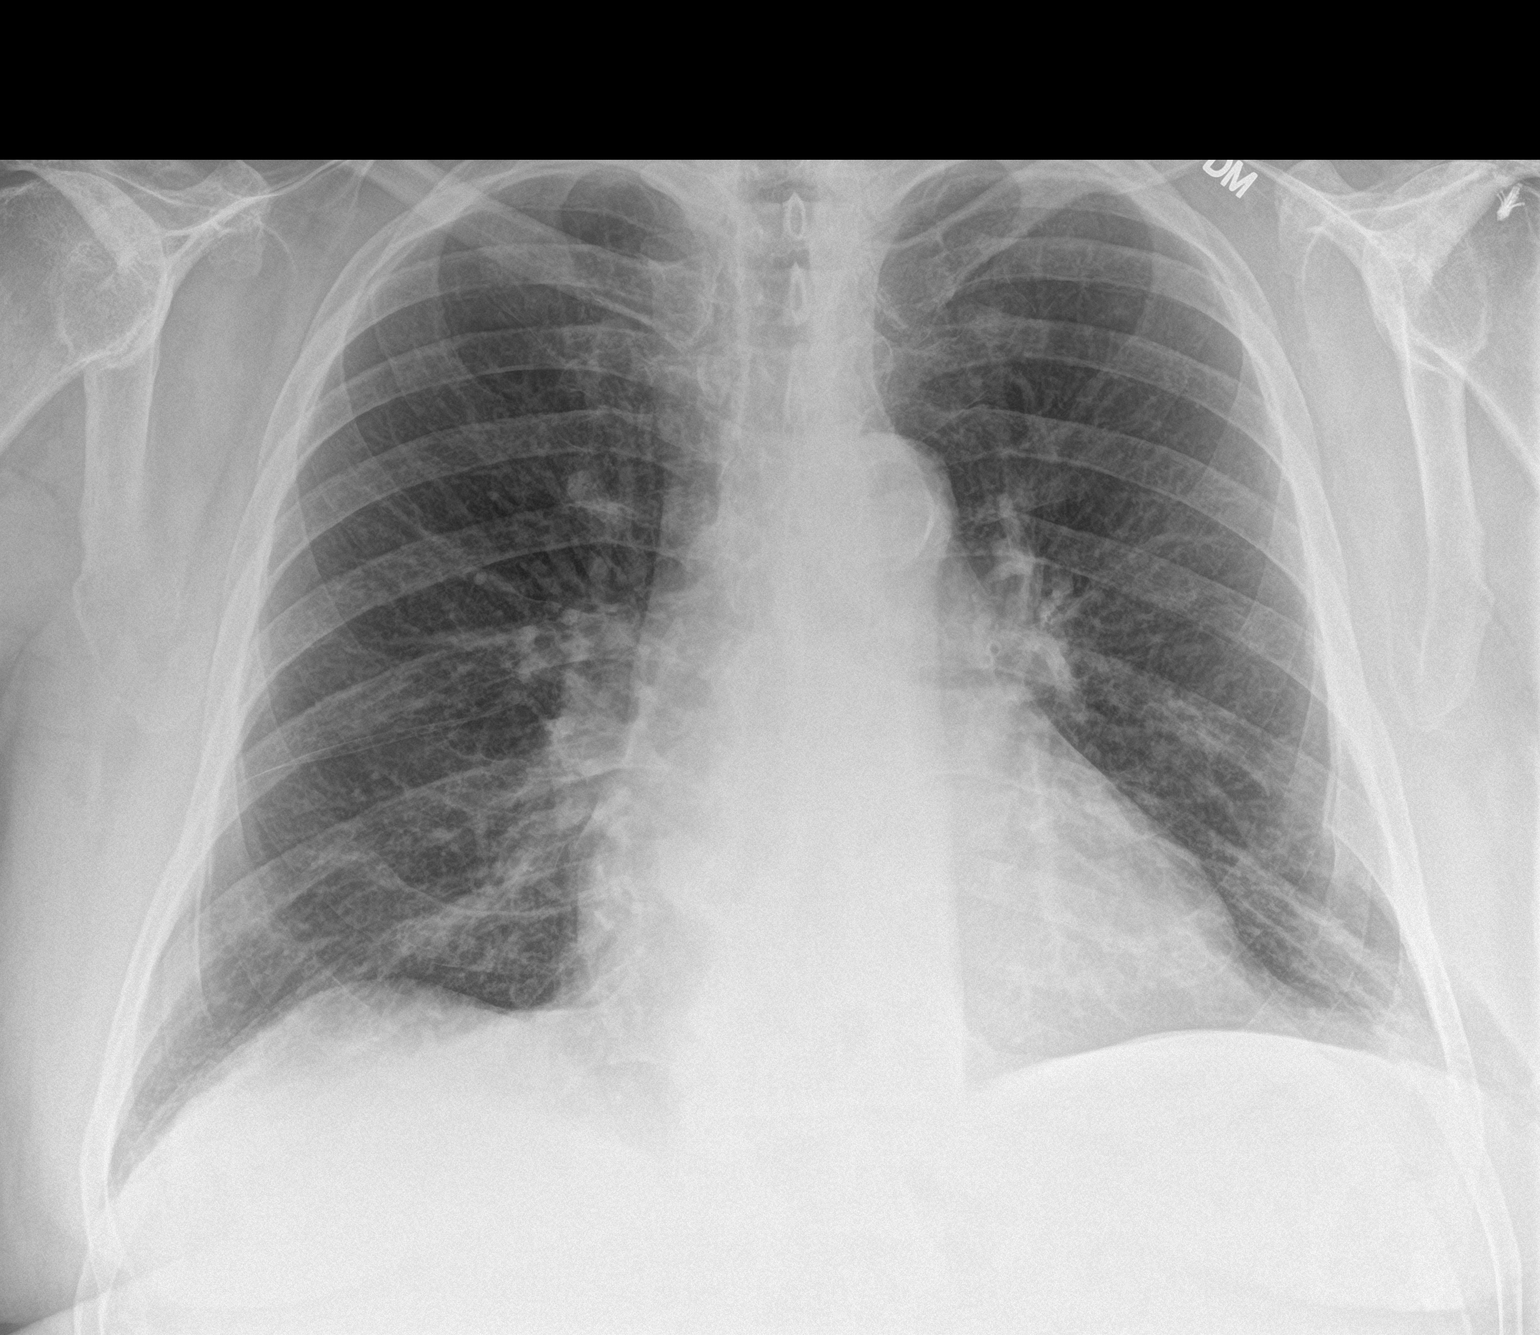

[chest lat]
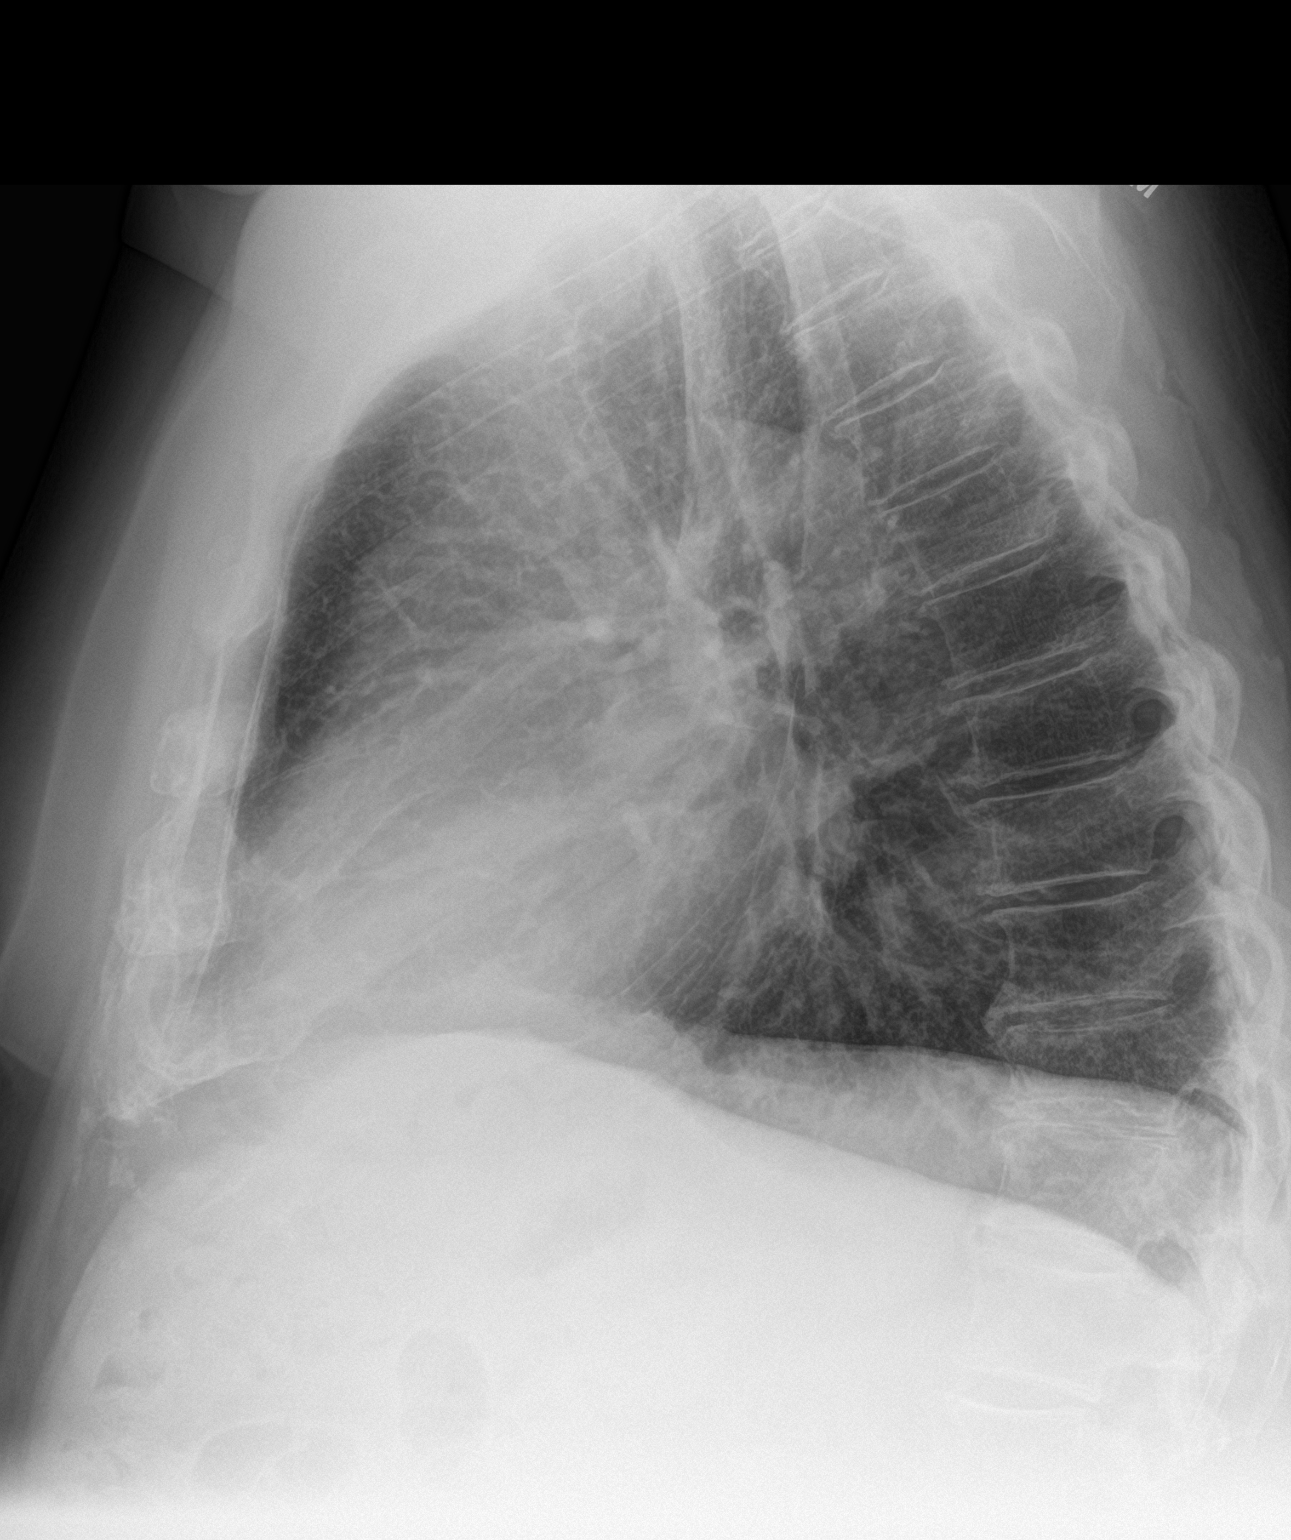

[2 of 2 positions shown; findings below may reference images not displayed]

FINDINGS: There is mild scarring in the left lung base. Lungs elsewhere are
clear. Heart is upper normal in size with pulmonary vascularity
within normal limits. There is mild degenerative change in the
thoracic spine. There is atherosclerotic calcification in the aorta.
No adenopathy.
IMPRESSION: Mild scarring left base. No edema or consolidation. Stable cardiac
silhouette.

## 2017-08-12 ENCOUNTER — Ambulatory Visit: Payer: Medicare Other | Admitting: Internal Medicine

## 2017-09-12 ENCOUNTER — Other Ambulatory Visit: Payer: Self-pay | Admitting: Internal Medicine

## 2017-09-12 MED ORDER — LEVALBUTEROL HCL 0.63 MG/3ML IN NEBU: 0.6300 mg | INHALATION_SOLUTION | Freq: Four times a day (QID) | RESPIRATORY_TRACT | 12 refills | Status: DC

## 2017-09-12 NOTE — Telephone Encounter (Signed)
Approved by CY and sent electronically

## 2017-10-23 ENCOUNTER — Telehealth: Payer: Self-pay | Admitting: Internal Medicine

## 2017-10-23 MED ORDER — TIOTROPIUM BROMIDE-OLODATEROL 2.5-2.5 MCG/ACT IN AERS: 2.0000 | INHALATION_SPRAY | Freq: Every day | RESPIRATORY_TRACT | 0 refills | Status: DC

## 2017-10-23 NOTE — Telephone Encounter (Signed)
Called and spoke with pt who stated he is having issues having thicker mucus that is harder for him to get rid of.  Sometimes when he is trying to get up mucus, he brings up blood along with the mucus due to pt being on coumadin.  Pt stated that he is currently back on 40mg  lasix.    Pt states that ever since he was diagnosed with a fib and put on metoprolol, when he was on 1.5 pills, he began having more SOB.  Pt states he is wanting to know what he should do with everything that is going on. Pt is currently taking Advair and is wanting to know if that should be increased or changed. Pt states that the metoprolol was lowered when he began to have problems but due to going between multiple doctors, is not sure what he should do.  Pt does have a follow up appt with CY 11/27/17 but is wanting to know if any recommendations can be made.  Dr. Maple HudsonYoung, please advise on this. Thanks

## 2017-10-23 NOTE — Telephone Encounter (Signed)
Mucinex  otc may help loosen mucus.  Offer sample trial of Stiolto 2 puffs, once daily, instead of Advair

## 2017-10-23 NOTE — Telephone Encounter (Signed)
Called pt to tell him to try mucinex otc to see if that would help loosen the mucus and also that CY wanted to try him on Stiolto instead of Advair to see if that would help him better.  Told pt we had a sample of Stiolto I was placing up front for him to come pick up while he called his insurance company to make sure Stiolto was one that was covered by them.  Pt expressed understanding. Nothing further needed at this time.

## 2017-11-24 ENCOUNTER — Other Ambulatory Visit: Payer: Self-pay | Admitting: Internal Medicine

## 2017-11-24 NOTE — Telephone Encounter (Signed)
CY please advise on refill. Thanks. 

## 2017-11-24 NOTE — Telephone Encounter (Signed)
Ok to refill x 6 months 

## 2017-11-27 ENCOUNTER — Ambulatory Visit: Payer: Medicare Other | Admitting: Internal Medicine

## 2017-12-01 ENCOUNTER — Encounter: Payer: Self-pay | Admitting: Internal Medicine

## 2017-12-01 ENCOUNTER — Ambulatory Visit: Payer: Medicare Other | Admitting: Internal Medicine

## 2017-12-01 ENCOUNTER — Ambulatory Visit (INDEPENDENT_AMBULATORY_CARE_PROVIDER_SITE_OTHER)
Admission: RE | Admit: 2017-12-01 | Discharge: 2017-12-01 | Disposition: A | Discharge status: Home / Self Care | Payer: Medicare Other | Source: Ambulatory Visit | Attending: Internal Medicine | Admitting: Internal Medicine

## 2017-12-01 VITALS — BP 120/78 | HR 68 | Ht 67.75 in | Wt 267.8 lb

## 2017-12-01 DIAGNOSIS — J449 Chronic obstructive pulmonary disease, unspecified: Secondary | ICD-10-CM

## 2017-12-01 DIAGNOSIS — J4540 Moderate persistent asthma, uncomplicated: Secondary | ICD-10-CM

## 2017-12-01 DIAGNOSIS — I482 Chronic atrial fibrillation, unspecified: Secondary | ICD-10-CM

## 2017-12-01 DIAGNOSIS — G4733 Obstructive sleep apnea (adult) (pediatric): Secondary | ICD-10-CM | POA: Diagnosis not present

## 2017-12-01 MED ORDER — AZITHROMYCIN 250 MG PO TABS
ORAL_TABLET | ORAL | 0 refills | Status: DC
Start: 2017-12-01 — End: 2020-04-17

## 2017-12-01 MED ORDER — FLUTICASONE-UMECLIDIN-VILANT 100-62.5-25 MCG/INH IN AEPB: 1.0000 | INHALATION_SPRAY | Freq: Every day | RESPIRATORY_TRACT | 0 refills | Status: DC

## 2017-12-01 MED ORDER — METHYLPREDNISOLONE ACETATE 80 MG/ML IJ SUSP
80.0000 mg | Freq: Once | INTRAMUSCULAR | Status: AC
Start: 2017-12-01 — End: 2017-12-01
  Administered 2017-12-01: 80 mg via INTRAMUSCULAR

## 2017-12-01 NOTE — Patient Instructions (Addendum)
Sample Trelegy x 2    Inhale 1 puff, then rinse mouth, once daily    Try this instead of Advair. When the samples run out, go back to Advair for comparison.  Order- Depo [id number]     Dx COPD mixed type  Order- CXR      Order- Schedule unattended home sleep test     Dx OSA  Order- Walk test on room air      Please call as needed

## 2017-12-01 NOTE — Progress Notes (Signed)
HPI male former smoker followed for asthma/bronchitis, allergic rhinitis, complicated by prior history OSA, GERD, AFib,  Office Spirometry 03/22/2016-moderate restriction of exhaled volume and moderate obstruction. FVC 2.48/64%, FEV1 1.61/58%, ratio 0.65, FEF 25-75 percent 0.89/44%. PFT 07/11/16-moderate obstructive disease with response to bronchodilator. FVC 2.54/75%, FEV1 1.69/70%, ratio 0.66, TLC 104%, DLCO 88% ----------------------------------------------------------------------------------------------------  04/11/17- 77 year old male former smoker followed for asthma/bronchitis, allergic rhinitis, complicated by prior history OSA, GERD, AFib/ warfarin,  FOLLOWS FOR:Pt states he has had trouble with increased SOB. Was dx'd with Afib since last visit;stopped Theo and albuterol neb tx as he read that those meds could interfere with PAfib.  Has since then started the duoneb back with understanding from Cardiology office to not use all the time.  Patient was scheduled for appointment yesterday but arrived today instead- we were able to work- in. Using Xopenex neb solution and DuoNeb, albuterol rescue inhaler, Advair 250/50 PFT 07/11/16-moderate obstructive disease with response to bronchodilator. FVC 2.54/75%, FEV1 1.69/70%, ratio 0.66, TLC 104%, DLCO 88% He notices wheezing a couple of times a week, using rescue inhaler 0-2 times daily. He stopped theophylline and albuterol tablets because of his atrial fibrillation. We notice he is no longer on Singulair, unsure why. Nebulizer machine is worn out. CXR 03/22/16 IMPRESSION: Mild scarring left base. No edema or consolidation. Stable cardiac Silhouette.  12/01/17- 77 year old male former smoker followed for asthma/bronchitis, allergic rhinitis, complicated by prior history OSA, GERD, AFib/ warfarin, ----Asthma; Pt states he is having issues with breathing-SOB with short distances. Wheezing as well.    neb xopenex , Advair 250, albuterol hfa,  Wife and  daughter here today "to make sure he tells everything".  More dyspnea on exertion over the last year and a half with no acute event.  Has had cardiac workup including cath last week "only electrical problems".  No pacemaker. He quit theophylline and albuterol tablets finally out of his concern about atrial fibrillation.  We had talked about this for years.  Not diabetic and no glaucoma. Cough can be quite productive.  Recently more yellow with no blood.  No fever. Snores.  More daytime sleepiness.  Separate bedrooms. Walk Test on room air 12/01/17-low saturation 95%, highest heart rate 118.  ROS-see HPI + = positive Constitutional:   No-   weight loss, night sweats, fevers, chills, fatigue, lassitude. HEENT:   No-  headaches, difficulty swallowing, tooth/dental problems, sore throat,       No-  sneezing, itching, ear ache, nasal congestion, post nasal drip,  CV:  No-   chest pain, orthopnea, PND, swelling in lower extremities, anasarca,                                                               dizziness,  palpitations Resp: + shortness of breath with exertion or at rest.             + productive cough,  No non-productive cough,  No- coughing up of blood.             + change in color of mucus.  + wheezing.   Skin: No-   rash or lesions. GI:   GU: . MS:  No-   joint pain or swelling.   Neuro-     nothing unusual Psych:  No- change  in mood or affect. No depression or anxiety.  No memory loss.  OBJ- Physical Exam General- Alert, Oriented, Affect-appropriate, Distress- none acute, + obese Skin- rash-none, lesions- none, excoriation- none Lymphadenopathy- none Head- atraumatic            Eyes- Gross vision intact, PERRLA, conjunctivae and secretions clear            Ears- +hard of hearing,+ hearing aids            Nose- Clear, no-Septal dev, mucus, polyps, erosion, perforation             Throat- Mallampati III , mucosa clear , drainage- none, tonsils- atrophic Neck- flexible , trachea  midline, no stridor , thyroid nl, carotid no bruit Chest - symmetrical excursion , unlabored           Heart/CV- IRR +AFib , no murmur , no gallop  , no rub, nl s1 s2                           - JVD- none , edema+ trace, stasis changes- none, varices- none           Lung- clear to P&A, wheeze-none, unlabored, cough- none , dullness-none, rub- none           Chest wall-  Abd-  Br/ Gen/ Rectal- Not done, not indicated Extrem- cyanosis- none, clubbing, none, atrophy- none, strength- nl, + bilateral knee replacement scars Neuro- grossly intact to observation

## 2017-12-02 ENCOUNTER — Telehealth: Payer: Self-pay | Admitting: Internal Medicine

## 2017-12-02 MED ORDER — FLUTICASONE-SALMETEROL 500-50 MCG/DOSE IN AEPB: 1.0000 | INHALATION_SPRAY | Freq: Two times a day (BID) | RESPIRATORY_TRACT | 3 refills | Status: DC

## 2017-12-02 NOTE — Telephone Encounter (Signed)
Yes- Suggest change to Advair 500   #1    Ref x 12,      Inhale 1 puff then rinse mouth well, twice daily

## 2017-12-02 NOTE — Telephone Encounter (Signed)
Called and spoke with pt who stated he had no relief when taking Trelegy and due to this he is wanting to go back to the Advair.  Pt stated around 6pm, pt began coughing a lot and had to take a mucinex and was able to cough up phlegm.  Pt stated he went through a whole box of kleenex due to all this.  Pt ended up doing a neb tx last night and woke up this morning and was having issues with SOB.  He did another breathing tx early this morning and due to problems he has had with the Trelegy.  Dr. Maple HudsonYoung, please advise if it is okay for pt to switch back to the Advair and stop the Trelegy. Thanks!  Allergies  Allergen Reactions  . Penicillins     REACTION as a child-unsure what happened    Current Outpatient Medications:  .  albuterol (VENTOLIN HFA) 108 (90 Base) MCG/ACT inhaler, Inhale 2 puffs into the lungs every 4 (four) hours as needed for wheezing or shortness of breath., Disp: 18 g, Rfl: 11 .  aspirin 81 MG tablet, Take 81 mg by mouth daily., Disp: , Rfl:  .  azithromycin (ZITHROMAX) 250 MG tablet, 2 today then one daily, Disp: 6 tablet, Rfl: 0 .  diazepam (VALIUM) 5 MG tablet, Take 5 mg by mouth daily., Disp: , Rfl:  .  diltiazem (CARDIZEM CD) 300 MG 24 hr capsule, Take 300 mg by mouth daily., Disp: , Rfl:  .  fluticasone (FLONASE) 50 MCG/ACT nasal spray, Place 2 sprays into both nostrils daily., Disp: , Rfl:  .  Fluticasone-Salmeterol (ADVAIR DISKUS) 250-50 MCG/DOSE AEPB, 1 puff then rinse mouth, twice daily maintenance, Disp: 60 each, Rfl: 12 .  Fluticasone-Umeclidin-Vilant (TRELEGY ELLIPTA) 100-62.5-25 MCG/INH AEPB, Inhale 1 puff into the lungs daily., Disp: 2 each, Rfl: 0 .  furosemide (LASIX) 40 MG tablet, Take 40 mg by mouth as needed for fluid., Disp: , Rfl:  .  ipratropium-albuterol (DUONEB) 0.5-2.5 (3) MG/3ML SOLN, USE 1 VIAL VIA NEBULIZER EVERY 6 HOURS AS DIRECTED, Disp: 180 mL, Rfl: 1 .  levalbuterol (XOPENEX) 0.63 MG/3ML nebulizer solution, Take 3 mLs (0.63 mg total) by  nebulization every 6 (six) hours., Disp: 120 mL, Rfl: 12 .  metoprolol succinate (TOPROL-XL) 50 MG 24 hr tablet, Take 75 mg by mouth 2 (two) times daily. Take with or immediately following a meal. , Disp: , Rfl:  .  Multiple Vitamin (MULTIVITAMIN) capsule, Take 1 capsule by mouth daily., Disp: , Rfl:  .  Nebulizers (COMPRESSOR/NEBULIZER) MISC, Use as directed, Disp: 1 each, Rfl: prn .  omeprazole (PRILOSEC) 20 MG capsule, Take 20 mg by mouth daily., Disp: , Rfl:  .  sildenafil (VIAGRA) 100 MG tablet, Take 100 mg by mouth daily as needed for erectile dysfunction., Disp: , Rfl:  .  temazepam (RESTORIL) 30 MG capsule, TAKE 1 CAPSULE BY MOUTH AS NEEDED SLEEP, Disp: 30 capsule, Rfl: 5 .  umeclidinium bromide (INCRUSE ELLIPTA) 62.5 MCG/INH AEPB, Inhale 1 puff into the lungs daily., Disp: 1 each, Rfl: 12 .  warfarin (COUMADIN) 6 MG tablet, 1 tab daily  or as directed, Disp: , Rfl:

## 2017-12-02 NOTE — Telephone Encounter (Signed)
Spoke with patient. He is aware of CY's recs. Advair has been sent to [facility] per patient's request.

## 2017-12-03 NOTE — Assessment & Plan Note (Signed)
Family describes snoring and he notices daytime sleepiness.  Remote assessment for OSA responded to weight loss which could not be sustained. Plan-schedule sleep study

## 2017-12-03 NOTE — Assessment & Plan Note (Signed)
Ventricular response rate fairly well controlled.  Continues management by cardiology.

## 2017-12-03 NOTE — Assessment & Plan Note (Addendum)
He used to feel that theophylline and albuterol tablets helped his breathing.  Those were discontinued because of his atrial fibrillation.  We discussed medicine classes available. We can assume that his obesity/deconditioning and his cardiac disease contribute to dyspnea on exertion. Plan-sample Trelegy.  If that is not helpful, go back to Advair.  CXR.  Treat for possible active bronchitis with Z-Pak.  Continue home nebulizer treatments with Xopenex-discussed. Consider adding separate LAMA if Trelegy not successful.

## 2017-12-30 DIAGNOSIS — G4733 Obstructive sleep apnea (adult) (pediatric): Secondary | ICD-10-CM | POA: Diagnosis not present

## 2017-12-31 ENCOUNTER — Other Ambulatory Visit: Payer: Self-pay | Admitting: *Deleted

## 2017-12-31 DIAGNOSIS — G4733 Obstructive sleep apnea (adult) (pediatric): Secondary | ICD-10-CM | POA: Diagnosis not present

## 2018-01-12 ENCOUNTER — Telehealth: Payer: Self-pay | Admitting: Internal Medicine

## 2018-01-12 DIAGNOSIS — G4733 Obstructive sleep apnea (adult) (pediatric): Secondary | ICD-10-CM

## 2018-01-12 NOTE — Telephone Encounter (Signed)
Spoke with the pt  He is requesting the results of his HST  Please advise, thanks!

## 2018-01-13 NOTE — Telephone Encounter (Signed)
His home sleep test showed moderate obstructive sleep apnea, averaging 18.9 apneas/ hour, with drops in blod oxygen level.  Recommend DME, new CPAP auto 5-20, mask of choice, humidifier supplies, AirView   Dx OSA  Please make sure he has a return ov within 31-90 days, per insurance regs.

## 2018-01-13 NOTE — Telephone Encounter (Signed)
ATC pt, no answer. Left message for pt to call back.  

## 2018-01-13 NOTE — Telephone Encounter (Signed)
Pt is calling back [phone]  

## 2018-01-13 NOTE — Telephone Encounter (Signed)
Attempted to call patient, no answer, message left to call back.  

## 2018-01-14 NOTE — Telephone Encounter (Signed)
Pt is calling back [phone]  

## 2018-01-14 NOTE — Telephone Encounter (Signed)
Spoke with patient. He is aware of his HST results. He wishes to proceed with the cpap machine. Patient stated that he is call back to schedule his appt once he has received his CPAP machine.   Order has been placed. Nothing else needed at time of call.

## 2018-02-18 ENCOUNTER — Telehealth: Payer: Self-pay | Admitting: Internal Medicine

## 2018-02-18 MED ORDER — FLUTICASONE-SALMETEROL 500-50 MCG/DOSE IN AEPB: 1.0000 | INHALATION_SPRAY | Freq: Two times a day (BID) | RESPIRATORY_TRACT | 3 refills | Status: DC

## 2018-02-18 MED ORDER — DOXYCYCLINE HYCLATE 100 MG PO TABS: 100.0000 mg | ORAL_TABLET | Freq: Two times a day (BID) | ORAL | 0 refills | Status: DC

## 2018-02-18 NOTE — Telephone Encounter (Signed)
Spoke with pt, aware of recs.  rx sent to preferred pharmacy.  Nothing further needed.  

## 2018-02-18 NOTE — Telephone Encounter (Signed)
Pt requesting Advair refill.  This has been sent to preferred pharmacy.    Pt also states that he was dx'ed with chronic bronchitis by PCP, was put on doxycycline which helped s/s. Approx 2 weeks after, pt started cpap.  Since starting cpap pt has experienced increased sob, producing yellow mucus.  Pt is concerned that the cpap is causing these symptoms.   Pt had tried to get a "so clean" cpap cleaning machine for his cpap, but did not- states that [facility] informed him that using a cleaning machine makes the warranty on his cpap obsolete.  Pt has been manually cleaning machine daily.    Wants to make CY aware, and requesting recs on how to properly clean machine without losing his warrant/see if his cpap could be causing his symptoms.  CY please advise.  Thanks.

## 2018-02-18 NOTE — Telephone Encounter (Signed)
Patient would like recs for prod cough with yellow mucus if this is not coming from his cpap machine.   Denies chest pain, fever, sinus congestion.  CY please advise.  Thanks!

## 2018-02-18 NOTE — Telephone Encounter (Signed)
Offer doxycycline 100 nmg, # 14, 1 twice daily. Ask him to call back and let us know if this helps even temporarily.

## 2018-02-18 NOTE — Telephone Encounter (Signed)
We don't usually get complaints suggesting association of CPAP with respiratory infections, despite the advertising claims made by "[employer]". He should have a pamphlet with his CPAP machine, that describes the manufacturer's recommendations for cleaning it.

## 2018-03-06 ENCOUNTER — Ambulatory Visit (INDEPENDENT_AMBULATORY_CARE_PROVIDER_SITE_OTHER): Payer: Medicare Other | Admitting: Internal Medicine

## 2018-03-06 ENCOUNTER — Encounter: Payer: Self-pay | Admitting: Internal Medicine

## 2018-03-06 VITALS — BP 128/78 | HR 88 | Ht 67.75 in | Wt 273.6 lb

## 2018-03-06 DIAGNOSIS — G4733 Obstructive sleep apnea (adult) (pediatric): Secondary | ICD-10-CM | POA: Diagnosis not present

## 2018-03-06 DIAGNOSIS — J449 Chronic obstructive pulmonary disease, unspecified: Secondary | ICD-10-CM | POA: Diagnosis not present

## 2018-03-06 DIAGNOSIS — I482 Chronic atrial fibrillation, unspecified: Secondary | ICD-10-CM

## 2018-03-06 NOTE — Assessment & Plan Note (Signed)
Good start with CPAP.  He benefits with better sleep and download looks very good.  He feels claustrophobic with full facemask and is going to explore alternatives.  Might need a chinstrap.  Chronic nasal congestion may be a problem with alternative masks.

## 2018-03-06 NOTE — Assessment & Plan Note (Signed)
Better control of reactive airways now.  No wheezing on today's exam.  He finds Xopenex neb treatments helpful at bedtime. He is using a generic alternative to Advair[employer]- Wixela- and likes it. Plan-continue current meds

## 2018-03-06 NOTE — Patient Instructions (Signed)
Order- DME [facility] continue CPAP auto 5-20, mask of choice, humidifier, supplies, AirView           Please help him refit mask style- ok to use a nasal or nasal pillows mask with chin strap  Please talk to your primary doctor about your fluid retention  Please call us if we can help

## 2018-03-06 NOTE — Progress Notes (Signed)
HPI male former smoker followed for asthma/bronchitis, allergic rhinitis, complicated by prior history OSA, GERD, AFib,  Office Spirometry 03/22/2016-moderate restriction of exhaled volume and moderate obstruction. FVC 2.48/64%, FEV1 1.61/58%, ratio 0.65, FEF 25-75 percent 0.89/44%. PFT 07/11/16-moderate obstructive disease with response to bronchodilator. FVC 2.54/75%, FEV1 1.69/70%, ratio 0.66, TLC 104%, DLCO 88% HST-12/30/2017-AHI 18.9/hour, desaturation to 83%, body weight 267 pounds ---------------------------------------------------------------------------------------------------- 12/01/17- 77 year old male former smoker followed for asthma/bronchitis, allergic rhinitis, complicated by prior history OSA, GERD, AFib/ warfarin, ----Asthma; Pt states he is having issues with breathing-SOB with short distances. Wheezing as well.    neb xopenex , Advair 250, albuterol hfa,  Wife and daughter here today "to make sure he tells everything".  More dyspnea on exertion over the last year and a half with no acute event.  Has had cardiac workup including cath last week "only electrical problems".  No pacemaker. He quit theophylline and albuterol tablets finally out of his concern about atrial fibrillation.  We had talked about this for years.  Not diabetic and no glaucoma. Cough can be quite productive.  Recently more yellow with no blood.  No fever. Snores.  More daytime sleepiness.  Separate bedrooms. Walk Test on room air 12/01/17-low saturation 95%, highest heart rate 118.  03/06/2018- 77 year old male former smoker followed for asthma/bronchitis, allergic rhinitis, complicated by prior history OSA, GERD, AFib/ warfarin, Came without hearing aids again-very hard of hearing CPAP auto 5-20/[facility] Neb DuoNeb, neb Xopenex, Wixela 500, Incruse, albuterol HFA, Flonase Download 100% compliance AHI 4.9/hour.  He sleeps better and feels better using CPAP.  Feels somewhat claustrophobic with full facemask.  Having  less nasal congestion at night since he began giving himself a Xopenex nebulizer treatment at bedtime.  Exline blames water retention for weight gain despite furosemide. HST-12/30/2017-AHI 18.9/hour, desaturation to 83%, body weight 267 pounds CXR 12/01/2017 Mild chronic bronchitic changes.  No alveolar pneumonia nor CHF. Thoracic aortic atherosclerosis.  ROS-see HPI + = positive Constitutional:   No-   weight loss, night sweats, fevers, chills, fatigue, lassitude. HEENT:   No-  headaches, difficulty swallowing, tooth/dental problems, sore throat,       No-  sneezing, itching, ear ache, nasal congestion, post nasal drip,  CV:  No-   chest pain, orthopnea, PND, swelling in lower extremities, anasarca,                                                            dizziness,  palpitations Resp: + shortness of breath with exertion or at rest.              productive cough,  No non-productive cough,  No- coughing up of blood.              change in color of mucus.  + wheezing.   Skin: No-   rash or lesions. GI:   GU: . MS:  No-   joint pain or swelling.   Neuro-     nothing unusual Psych:  No- change in mood or affect. No depression or anxiety.  No memory loss.  OBJ- Physical Exam General- Alert, Oriented, Affect-appropriate, Distress- none acute, + obese Skin- rash-none, lesions- none, excoriation- none Lymphadenopathy- none Head- atraumatic            Eyes- Gross vision intact, PERRLA, conjunctivae and secretions  clear            Ears- +hard of hearing,+ hearing aids            Nose- Clear, no-Septal dev, mucus, polyps, erosion, perforation             Throat- Mallampati III , mucosa clear , drainage- none, tonsils- atrophic Neck- flexible , trachea midline, no stridor , thyroid nl, carotid no bruit Chest - symmetrical excursion , unlabored           Heart/CV- IRR +AFib , no murmur , no gallop  , no rub, nl s1 s2                           - JVD- none , edema+ 2, stasis changes- none,  varices- none           Lung- clear to P&A, wheeze-none, unlabored, cough- none , dullness-none, rub- none           Chest wall-  Abd-  Br/ Gen/ Rectal- Not done, not indicated Extrem- cyanosis- none, clubbing, none, atrophy- none, strength- nl, + bilateral knee replacement scars Neuro- grossly intact to observation

## 2018-03-06 NOTE — Assessment & Plan Note (Signed)
Rhythm feels very regular and may actually be sinus today.  He is managed by cardiology at [facility].

## 2018-03-10 ENCOUNTER — Telehealth: Payer: Self-pay | Admitting: Internal Medicine

## 2018-03-10 NOTE — Telephone Encounter (Signed)
Order was sent to [facility]-pt will need to contact DME. Pt states he contacted DME today and was told they did not have order. [facility] [place] [facility] [phone]-pt to contact that office.   [facility] [place] [facility] for mask fit.

## 2018-03-16 ENCOUNTER — Encounter: Payer: Self-pay | Admitting: Internal Medicine

## 2018-03-16 NOTE — Telephone Encounter (Signed)
Not sure how much medicine would make it through the machine, filter and hose. Don't want to give too much and trigger irregular heart beats. I have no data or studies to point to, but people have had this idea before. You would be on your own.

## 2018-03-16 NOTE — Telephone Encounter (Signed)
Dr. Maple HudsonYoung please advise on patient's below email.   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 But occasionally us floor sweepers come up with an idea worth consideration.  Every night, I do a breathing treatment, mainly because it helps me keep from getting stopped up overnight. Then I get on my C-Pap which is equipped with a humidifier. During my breathing treatment, roughly half of the medicine (Levilbuterol) goes into the air when I exhale. My thought is why not put the medicine in the humidifier reservoir and make it, in effect an all night breathing treatment?  I have a pretty good idea how much water is consumed every night,and while I'm sure that will increase when cold weather arrives and the humidity goes down in the house, that comes under the heading of "we'll cross that bridge when we come to it". I have no intention of doing this unless you think it's a good idea but it seems to me like it's killing two birds with one stone. I await your thoughts...Marland Kitchen.Marland Kitchen.Marina GoodellPerry

## 2018-03-23 NOTE — Telephone Encounter (Signed)
Neither the CPAP manufacturer or the maker of the nebulizer solution would approve of using CPAP as an aerosol nebulizer. They haven't been tested or FDA approved for this.I have no experience to draw on. I doubt it would be harmful if done as outlined.

## 2018-03-23 NOTE — Telephone Encounter (Signed)
Dr Maple HudsonYoung- please see pt email, thanks  ----- Message from Mychart, Generic sent at 03/23/2018 9:08 AM EDT -----    Your last note came as a bit of a surprise. I had put the idea aside because I was of the impression it didn't have your approval and my dad always told me if you're paying for advice, you'd better take it. Since we last communicated, I have changed masks and had to jack up the humidity level. It will probably be adjusted several more times before I get it right. Once I do, I will have a good idea of what dosing should be. I use levalbuterol because it's easier on the heart and would never use more than 1 vial (which is what I use every night < I'm not suicidal, grin> . My new cpap mask is a resmed, F30 and like my first, it's taking time to get used to. It's far more comfortable to wear but also far easier to dislodge. I've had it since last Friday and each night has been better. I hope in another week, I'll feel more comfortable with it. I'll keep you posted.

## 2018-04-26 ENCOUNTER — Encounter: Payer: Self-pay | Admitting: Internal Medicine

## 2018-07-08 ENCOUNTER — Encounter: Payer: Self-pay | Admitting: Internal Medicine

## 2018-07-08 ENCOUNTER — Ambulatory Visit: Payer: Medicare Other | Admitting: Internal Medicine

## 2018-07-08 VITALS — BP 120/74 | HR 70 | Ht 67.75 in | Wt 266.4 lb

## 2018-07-08 DIAGNOSIS — Z23 Encounter for immunization: Secondary | ICD-10-CM

## 2018-07-08 DIAGNOSIS — G4733 Obstructive sleep apnea (adult) (pediatric): Secondary | ICD-10-CM

## 2018-07-08 DIAGNOSIS — J449 Chronic obstructive pulmonary disease, unspecified: Secondary | ICD-10-CM

## 2018-07-08 NOTE — Patient Instructions (Signed)
We can continue CPAP auto 5-20, mask of choice, humidifier, supplies, AirView  We can continue current meds  Order- flu vax senior

## 2018-07-08 NOTE — Progress Notes (Signed)
HPI male former smoker followed for asthma/bronchitis, allergic rhinitis, OSA, complicated by prior history OSA, GERD, AFib,  Office Spirometry 03/22/2016-moderate restriction of exhaled volume and moderate obstruction. FVC 2.48/64%, FEV1 1.61/58%, ratio 0.65, FEF 25-75 percent 0.89/44%. PFT 07/11/16-moderate obstructive disease with response to bronchodilator. FVC 2.54/75%, FEV1 1.69/70%, ratio 0.66, TLC 104%, DLCO 88% HST-12/30/2017-AHI 18.9/hour, desaturation to 83%, body weight 267 pounds Walk Test on room air 12/01/17-low saturation 95%, highest heart rate 118. ----------------------------------------------------------------------------------------------------  03/06/2018- 77 year old male former smoker followed for asthma/bronchitis, allergic rhinitis, OSA complicated , GERD, AFib/ warfarin, Came without hearing aids again-very hard of hearing CPAP auto 5-20/[facility] Neb DuoNeb, neb Xopenex, Wixela 500, Incruse, albuterol HFA, Flonase Download 100% compliance AHI 4.9/hour.  He sleeps better and feels better using CPAP.  Feels somewhat claustrophobic with full facemask.  Having less nasal congestion at night since he began giving himself a Xopenex nebulizer treatment at bedtime.  Blames water retention for weight gain despite furosemide. HST-12/30/2017-AHI 18.9/hour, desaturation to 83%, body weight 267 pounds CXR 12/01/2017 Mild chronic bronchitic changes.  No alveolar pneumonia nor CHF. Thoracic aortic atherosclerosis.  07/08/2018- 77 year old male former smoker followed for asthma/bronchitis, allergic rhinitis, OSA complicated , GERD, AFib/ warfarin, CPAP auto 5-20/[facility] -----OSA: DME: [facility] [place] location. Pt wears CPAP and DL attached.  Body weight today 266 pounds Neb DuoNeb, neb Xopenex, Advair 500, Incruse, Ventolin HFA, Download compliance 90% AHI 6.2/hour.  He sleeps better and is comfortable with his CPAP.  Tried a new mask and went back to original. Asthma control has  been good this summer using Wixela, with less need for nebulizer treatments now about once a week. He credits change to Advair/ Wixela 500.  ROS-see HPI + = positive Constitutional:   No-   weight loss, night sweats, fevers, chills, fatigue, lassitude. HEENT:   No-  headaches, difficulty swallowing, tooth/dental problems, sore throat,       No-  sneezing, itching, ear ache, nasal congestion, post nasal drip,  CV:  No-   chest pain, orthopnea, PND, swelling in lower extremities, anasarca,                                                          dizziness,  palpitations Resp: + shortness of breath with exertion or at rest.              productive cough,  No non-productive cough,  No- coughing up of blood.              change in color of mucus.  + wheezing.   Skin: No-   rash or lesions. GI:   GU: . MS:  No-   joint pain or swelling.   Neuro-     nothing unusual Psych:  No- change in mood or affect. No depression or anxiety.  No memory loss.  OBJ- Physical Exam General- Alert, Oriented, Affect-appropriate, Distress- none acute, + obese Skin- rash-none, lesions- none, excoriation- none Lymphadenopathy- none Head- atraumatic            Eyes- Gross vision intact, PERRLA, conjunctivae and secretions clear            Ears- +hard of hearing,+ hearing aids- doing better this visit            Nose- Clear, no-Septal dev, mucus, polyps, erosion, perforation  Throat- Mallampati III , mucosa clear , drainage- none, tonsils- atrophic Neck- flexible , trachea midline, no stridor , thyroid nl, carotid no bruit Chest - symmetrical excursion , unlabored           Heart/CV- IRR +AFib , no murmur , no gallop  , no rub, nl s1 s2                           - JVD- none , edema+1/ elastic hose, stasis changes- none, varices- none           Lung-  wheeze+ end expiratory L base, unlabored, cough- none , dullness-none, rub- none           Chest wall-  Abd-  Br/ Gen/ Rectal- Not done, not  indicated Extrem- cyanosis- none, clubbing, none, atrophy- none, strength- nl, + bilateral knee replacement scars Neuro- grossly intact to observation

## 2018-07-09 NOTE — Assessment & Plan Note (Signed)
Benefiting from CPAP with improved sleep and controlled snoring.  Download confirms good compliance and adequate control as discussed. Plan-continue auto Pap 5-20

## 2018-07-09 NOTE — Assessment & Plan Note (Signed)
Exam is much clearer at this visit.  Still trace wheeze.  He thinks the improvement is from using high-dose Advair/Wixela, but better control of CHF component may be helping [place] as well.

## 2018-08-17 NOTE — Telephone Encounter (Signed)
Yes, we know about this. It is a surgical pacemaker. The person who has begun doing these in this area is Dr Christia Reading, ENT. Ok to refer Mr Charley if he wants to learn more about it.

## 2018-08-17 NOTE — Telephone Encounter (Signed)
Patient sent in a MyChart message asking about an Inspire device. From the looks of the website, it is a device that implanted into the patient to help with OSA without the use of a CPAP or oral appliance. Dr. Maple Hudson, are you familiar with this device? Please advise. Thanks!

## 2019-01-07 ENCOUNTER — Ambulatory Visit: Payer: Medicare Other | Admitting: Internal Medicine

## 2019-02-17 IMAGING — DX DG CHEST 2V
2 series · 2 of 2 positions shown · non-contrast
Comparison: Chest x-ray of July 07, 2017

CLINICAL DATA: Routine chest x-ray. History of asthma-COPD, atrial
fibrillation, and previous episodes of pneumonia. Former smoker.

EXAM:
CHEST  2 VIEW

[chest pa]
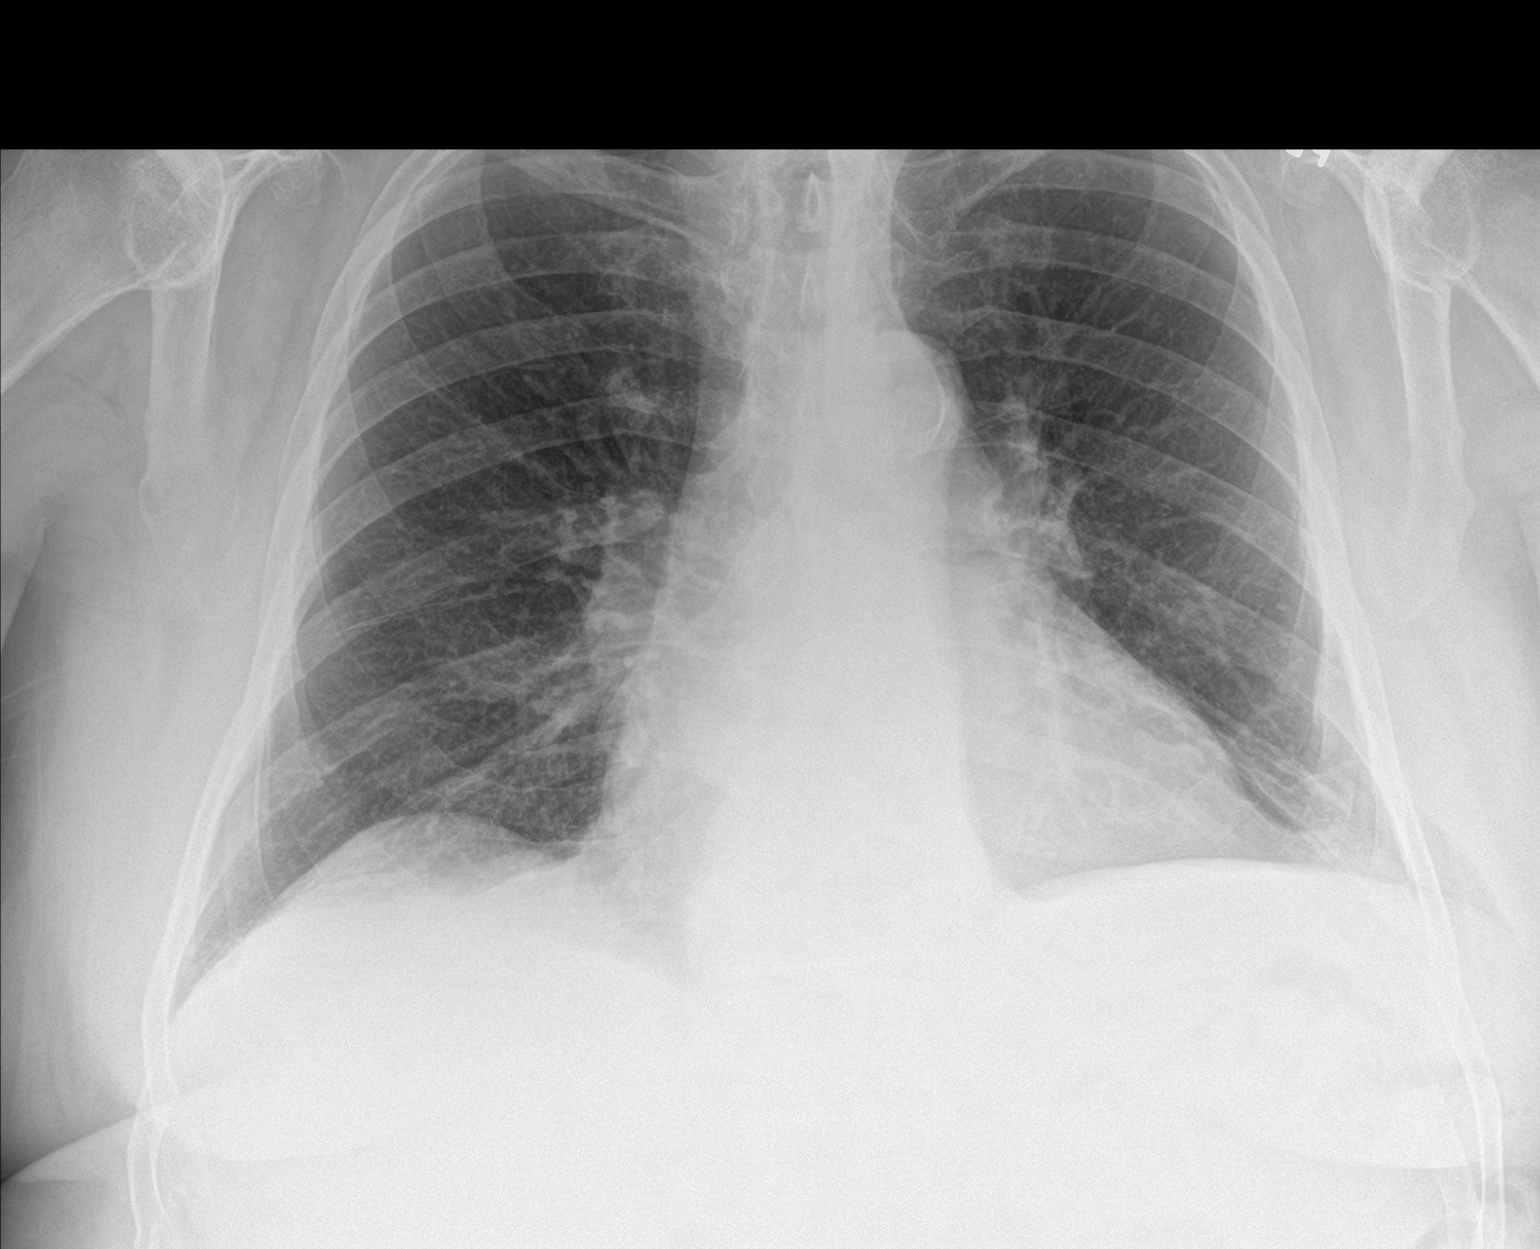

[chest lat]
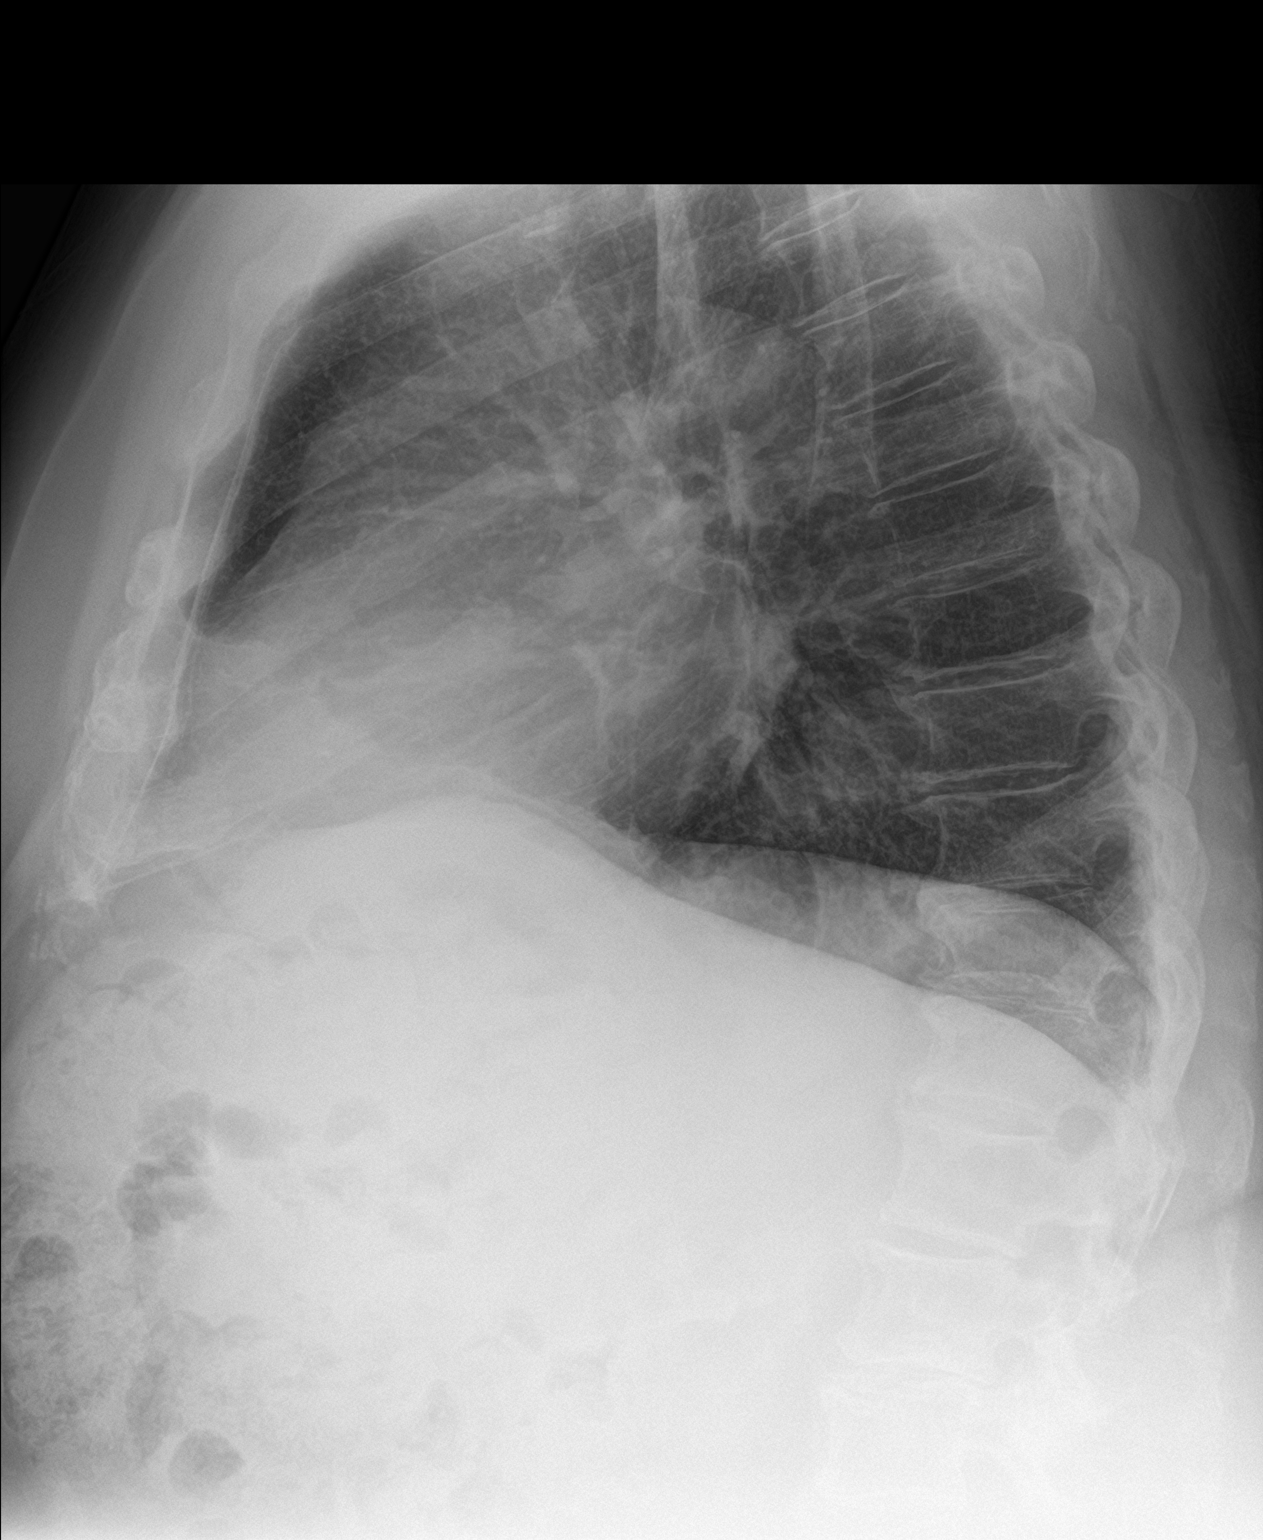

[2 of 2 positions shown; findings below may reference images not displayed]

FINDINGS: The lungs are well-expanded. There is no focal infiltrate. The
interstitial markings are coarse though stable. A previously
demonstrated infiltrate in the lingula has cleared. The heart is
top-normal in size. The pulmonary vascularity is normal. There is
calcification in the wall of the aortic arch. The bony thorax
exhibits no acute abnormality. There is an old midshaft left
clavicular fracture which has healed with deformity.
IMPRESSION: Mild chronic bronchitic changes.  No alveolar pneumonia nor CHF.

Thoracic aortic atherosclerosis.

## 2019-03-25 ENCOUNTER — Other Ambulatory Visit: Payer: Self-pay | Admitting: Internal Medicine

## 2019-04-01 ENCOUNTER — Telehealth: Payer: Self-pay | Admitting: Internal Medicine

## 2019-04-01 MED ORDER — LEVALBUTEROL HCL 0.63 MG/3ML IN NEBU: 0.6300 mg | INHALATION_SOLUTION | Freq: Two times a day (BID) | RESPIRATORY_TRACT | 3 refills | Status: DC | PRN

## 2019-04-01 NOTE — Telephone Encounter (Signed)
Returned call to patient, requesting refill of of Xopenex nebulizer solution. He states a 3 month supply using it QID ends up giving him a supply for about 1 year because he does not use it QID. He uses it PRN. He states during pollen season he is using twice a day and rarely in other seasons. He is requesting that we change his xopenex order to bid prn so he gets less quantity wise but he still wants a 90 day supply.   CY please advise if we can decrease his xopenex to bid prn. Thanks.

## 2019-04-01 NOTE — Telephone Encounter (Signed)
Ok to change and refill as he requests, thanks

## 2019-04-01 NOTE — Telephone Encounter (Signed)
Pt is calling back [phone]

## 2019-04-01 NOTE — Telephone Encounter (Signed)
Refill of xopenex neb sol has been sent to pharmacy for pt with the decreased amount per pt's request. Called and spoke with pt letting him know this had been done and pt verbalized understanding.nothing further needed.

## 2019-04-01 NOTE — Telephone Encounter (Signed)
Attempted to call pt but line went straight to VM. Left message for pt to return call. 

## 2019-04-13 ENCOUNTER — Encounter: Payer: Self-pay | Admitting: Internal Medicine

## 2019-04-15 ENCOUNTER — Ambulatory Visit: Payer: Medicare Other | Admitting: Internal Medicine

## 2019-04-15 ENCOUNTER — Other Ambulatory Visit: Payer: Self-pay

## 2019-04-15 ENCOUNTER — Encounter: Payer: Self-pay | Admitting: Internal Medicine

## 2019-04-15 DIAGNOSIS — G4733 Obstructive sleep apnea (adult) (pediatric): Secondary | ICD-10-CM

## 2019-04-15 DIAGNOSIS — J449 Chronic obstructive pulmonary disease, unspecified: Secondary | ICD-10-CM

## 2019-04-15 MED ORDER — LEVALBUTEROL HCL 0.63 MG/3ML IN NEBU: 0.6300 mg | INHALATION_SOLUTION | Freq: Four times a day (QID) | RESPIRATORY_TRACT | 3 refills | Status: DC | PRN

## 2019-04-15 NOTE — Patient Instructions (Signed)
We can continue CPAP auto 5-20, mask of choice, humidifier, supplies, AirView/ card  Script sent refilling neb solution  Please call if we can help

## 2019-04-15 NOTE — Assessment & Plan Note (Signed)
Continues to benefit from CPAP with good compliance and control. Plan- continue auto pap 5-20

## 2019-04-15 NOTE — Assessment & Plan Note (Signed)
Significant reactive component, well controlled now. Credits the 500 strength Wixela for much better control. Needs refill xopenex neb solution.

## 2019-04-15 NOTE — Progress Notes (Signed)
HPI male former smoker followed for asthma/bronchitis, allergic rhinitis, OSA, complicated by prior history OSA, GERD, AFib,  Office Spirometry 03/22/2016-moderate restriction of exhaled volume and moderate obstruction. FVC 2.48/64%, FEV1 1.61/58%, ratio 0.65, FEF 25-75 percent 0.89/44%. PFT 07/11/16-moderate obstructive disease with response to bronchodilator. FVC 2.54/75%, FEV1 1.69/70%, ratio 0.66, TLC 104%, DLCO 88% HST-12/30/2017-AHI 18.9/hour, desaturation to 83%, body weight 267 pounds Walk Test on room air 12/01/17-low saturation 95%, highest heart rate 118. ----------------------------------------------------------------------------------------------------  07/08/2018- 78 year old male former smoker followed for asthma/bronchitis, allergic rhinitis, OSA complicated , GERD, AFib/ warfarin, CPAP auto 5-20/[facility] -----OSA: DME: [place] location. Pt wears CPAP and DL attached.  Body weight today 266 pounds Neb DuoNeb, neb Xopenex, Advair 500, Incruse, Ventolin HFA, Download compliance 90% AHI 6.2/hour.  He sleeps better and is comfortable with his CPAP.  Tried a new mask and went back to original. Asthma control has been good this summer using Wixela, with less need for nebulizer treatments now about once a week. He credits change to Advair/ Wixela 500.  04/15/2019- 78 year old male former smoker followed for asthma/bronchitis, allergic rhinitis, OSA complicated , GERD, AFib/ warfarin, CPAP auto 5-20/[facility] Wixela 500, Incruse, Neb Xop 0.63, or Duoneb, albuterol hfa Body weight today 268 lbs Download 100% compliance, AHI 5.7/ hr -----OSA on CPAP 5-20, DME: [facility], using machine every night, reports issues with current mask (full faced); pt states he's had thick, clear mucus d/t seasonal changes Easy DOE attributed by his PCP to chronic AFib- no pacer.. Using neb 1-2x/ daily. Very much better control with Wixela 500. Little routine wheeze or cough. Pollen season did bother this  year with thicker mucus. Satisfied with CPAP. Reviewed download.  ROS-see HPI + = positive Constitutional:   No-   weight loss, night sweats, fevers, chills, fatigue, lassitude. HEENT:   No-  headaches, difficulty swallowing, tooth/dental problems, sore throat,       No-  sneezing, itching, ear ache, nasal congestion, post nasal drip,  CV:  No-   chest pain, orthopnea, PND, swelling in lower extremities, anasarca,                                                          dizziness,  palpitations Resp: + shortness of breath with exertion or at rest.              productive cough,  No non-productive cough,  No- coughing up of blood.              change in color of mucus.  + wheezing.   Skin: No-   rash or lesions. GI:   GU: . MS:  No-   joint pain or swelling.   Neuro-     nothing unusual Psych:  No- change in mood or affect. No depression or anxiety.  No memory loss.  OBJ- Physical Exam General- Alert, Oriented, Affect-appropriate, Distress- none acute, + morbidly obese Skin- rash-none, lesions- none, excoriation- none Lymphadenopathy- none Head- atraumatic            Eyes- Gross vision intact, PERRLA, conjunctivae and secretions clear            Ears- +hard of hearing,+ hearing aids- doing better this visit            Nose- Clear, no-Septal dev, mucus, polyps, erosion, perforation  Throat- Mallampati III , mucosa clear , drainage- none, tonsils- atrophic Neck- flexible , trachea midline, no stridor , thyroid nl, carotid no bruit Chest - symmetrical excursion , unlabored           Heart/CV- IRR +AFib , no murmur , no gallop  , no rub, nl s1 s2                           - JVD- none , edema+1, stasis changes- none, varices- none           Lung-  Wheeze- none, unlabored, cough- none , dullness-none, rub- none           Chest wall-  Abd-  Br/ Gen/ Rectal- Not done, not indicated Extrem- cyanosis- none, clubbing, none, atrophy- none, strength- nl, + bilateral knee replacement  scars Neuro- grossly intact to observation

## 2019-06-07 ENCOUNTER — Other Ambulatory Visit: Payer: Self-pay | Admitting: Internal Medicine

## 2020-04-17 ENCOUNTER — Other Ambulatory Visit: Payer: Self-pay

## 2020-04-17 ENCOUNTER — Ambulatory Visit: Payer: Medicare Other | Admitting: Internal Medicine

## 2020-04-17 ENCOUNTER — Encounter: Payer: Self-pay | Admitting: Internal Medicine

## 2020-04-17 VITALS — BP 128/86 | HR 67 | Temp 97.8°F | Ht 70.0 in | Wt 266.4 lb

## 2020-04-17 DIAGNOSIS — G4733 Obstructive sleep apnea (adult) (pediatric): Secondary | ICD-10-CM | POA: Diagnosis not present

## 2020-04-17 DIAGNOSIS — J449 Chronic obstructive pulmonary disease, unspecified: Secondary | ICD-10-CM | POA: Diagnosis not present

## 2020-04-17 DIAGNOSIS — R063 Periodic breathing: Secondary | ICD-10-CM

## 2020-04-17 NOTE — Patient Instructions (Signed)
Order- Schedule PFT    Dx  COPD mixed type  Order-  CXR   Dx COPD mixed type  Ok to continue CPAP auto 5-20, mask of choice, humidifier, supplies, Airview/ card  Please call if we can help

## 2020-04-17 NOTE — Progress Notes (Signed)
HPI male former smoker followed for asthma/bronchitis, allergic rhinitis, OSA, complicated by prior history OSA, GERD, AFib,  Office Spirometry 03/22/2016-moderate restriction of exhaled volume and moderate obstruction. FVC 2.48/64%, FEV1 1.61/58%, ratio 0.65, FEF 25-75 percent 0.89/44%. PFT 07/11/16-moderate obstructive disease with response to bronchodilator. FVC 2.54/75%, FEV1 1.69/70%, ratio 0.66, TLC 104%, DLCO 88% HST-12/30/2017-AHI 18.9/hour, desaturation to 83%, body weight 267 pounds Walk Test on room air 12/01/17-low saturation 95%, highest heart rate 118. ----------------------------------------------------------------------------------------------------   04/15/2019- 79 year old male former smoker followed for asthma/bronchitis, allergic rhinitis, OSA complicated , GERD, AFib/ warfarin, CPAP auto 5-20/[facility] Wixela 500, Incruse, Neb Xop 0.63, or Duoneb, albuterol hfa Body weight today 268 lbs Download 100% compliance, AHI 5.7/ hr -----OSA on CPAP 5-20, DME: [facility], using machine every night, reports issues with current mask (full faced); pt states he's had thick, clear mucus d/t seasonal changes Easy DOE attributed by his PCP to chronic AFib- no pacer.. Using neb 1-2x/ daily. Very much better control with Wixela 500. Little routine wheeze or cough. Pollen season did bother this year with thicker mucus. Satisfied with CPAP. Reviewed download.  04/17/20- 79 year old male former smoker followed for asthma/bronchitis, allergic rhinitis, OSA complicated , GERD, AFib/ warfarin, HTN, CAD,  CPAP auto 5-20/[facility] Download compliance 100%, AHI 6.1/ hr      NOTE 13 minutes recorded as [place], implying possible cerebral perfusion lag. Wixela 500, Incruse, Neb Xop 0.63, or Duoneb, albuterol hfa,  Body weight today 266 lbs ---pt is here for cpap compliance .pt has sob whening wearing mask.pt needs you to look at spriometry results Covid vax- 2 Moderna Covid mask increases sense of dyspnea  when active. Remains in AFib.  CXR 12/01/17- IMPRESSION: Mild chronic bronchitic changes.  No alveolar pneumonia nor CHF. Thoracic aortic atherosclerosis.   ROS-see HPI + = positive Constitutional:   No-   weight loss, night sweats, fevers, chills, fatigue, lassitude. HEENT:   No-  headaches, difficulty swallowing, tooth/dental problems, sore throat,       No-  sneezing, itching, ear ache, nasal congestion, post nasal drip,  CV:  No-   chest pain, orthopnea, PND, swelling in lower extremities, anasarca,                                                          dizziness,  palpitations Resp: + shortness of breath with exertion or at rest.              productive cough,  No non-productive cough,  No- coughing up of blood.              change in color of mucus.  + wheezing.   Skin: No-   rash or lesions. GI:   GU: . MS:  No-   joint pain or swelling.   Neuro-     nothing unusual Psych:  No- change in mood or affect. No depression or anxiety.  No memory loss.  OBJ- Physical Exam General- Alert, Oriented, Affect-appropriate, Distress- none acute, + morbidly obese Skin- rash-none, lesions- none, excoriation- none Lymphadenopathy- none Head- atraumatic            Eyes- Gross vision intact, PERRLA, conjunctivae and secretions clear            Ears- +hard of hearing,+ hearing aids  Nose- Clear, no-Septal dev, mucus, polyps, erosion, perforation             Throat- Mallampati III , mucosa clear , drainage- none, tonsils- atrophic Neck- flexible , trachea midline, no stridor , thyroid nl, carotid no bruit Chest - symmetrical excursion , unlabored           Heart/CV- +IRR +AFib , no murmur , no gallop  , no rub, nl s1 s2                           - JVD- none , edema+1, stasis changes- none, varices- none           Lung-  +diminished, Wheeze- none, unlabored, cough- none , dullness-none, rub- none           Chest wall-  Abd-  Br/ Gen/ Rectal- Not done, not indicated Extrem-  cyanosis- none, clubbing, none, atrophy- none, strength- nl, + bilateral knee replacement scars Neuro- grossly intact to observation

## 2020-07-09 DIAGNOSIS — R063 Periodic breathing: Secondary | ICD-10-CM | POA: Insufficient documentation

## 2020-07-09 NOTE — Assessment & Plan Note (Signed)
Benefits with good compliance Plan- continue auto 5-20

## 2020-07-09 NOTE — Assessment & Plan Note (Signed)
Moderate persistent uncomplicated Plan- continue present medss

## 2020-07-09 NOTE — Assessment & Plan Note (Signed)
Probably reflects some feed-back loop delay related to cerebral perfusion Cardiology continues to follow AFib.

## 2020-10-10 NOTE — Telephone Encounter (Signed)
Also see new message sent by pt:  To: [facility]    From: [name]    Created: 10/10/2020 11:53 AM     *-*-*This message has not been handled.*-*-*  [name],I am considering putting the cpap in the closet.  Of late, it appears to me that I'm getting lousy results with it anyway.  Besides, I'm having a problem with my sinuses draining while I am sleeping and in about 3 or 4 hours, my mouth and throat feel like it's a [place] armie latrene.  I then get up, go to the bathroom and wash my mouth out, go back to bed and I'm good for another 4 hours.  I get up and my incidents are usually between 10 and 14 per hour.  They recently sent me a heated tube and when I plugged that in, it changes most of the settings on my cpap.  I called [facility] who told me they would have someone call me back (AS NOBODY WAT THERE WHO KNEW ANYTHING) and nobody called me back.  While I can appreciate their trying to cultivate new customers, they don't seem to care much about keeping the ones they already have.  By nature, I'm not a complainer but it makes no sense to me to have a device that does no better than the sleep study I had when I started all this.  Normally, I'd be happy to go see [facility] on Friday, but the trip isn't safe for me right now.  I also get the distinct impression that [facility] doesn't care about me and as I depend on them to help keep me alive, believe me it isn't a comfortable feeling.

## 2020-10-10 NOTE — Telephone Encounter (Signed)
Download has been printed from Airview of pt's CPAP data and this has been provided to Dr. Maple Hudson. In Airview, see that pt's CPAP set up date was 01/30/2018.

## 2020-10-10 NOTE — Telephone Encounter (Signed)
Please offer to help him change DME from [facility] to one closer to his home to take over his CPAP support  Needs current settings, mask of choice, humidifier, supplies,s AirView/ card.  . Please ask [facility] for download and how old is his machine?

## 2020-10-10 NOTE — Telephone Encounter (Signed)
Dr. Maple Hudson, please see mychart message sent by pt and advise:  To: [facility]    From: [name]    Created: 10/10/2020 10:15 AM     *-*-*This message was handled on 10/10/2020 11:19 AM by Deicy Rusk P*-*-*  Is there someone other than [facility] where I can go to get supplies and more importantly, help with my machine?  1 the company hasn't gotten an order right in over a year.  I need someone to look at the settings on my machine, and the only way I can do that is to go to [facility] on a Friday morning (that's 60 miles round trip).  Being 80 years old and having recently had a fall which has kept me home for the last 3 weeks, and one that messed up my shoulder in the process, I simply don't feel safe driving to [place] at this time.  There are other issues with [facility] I won't bore you with but the bottom line is, They have been close to no help what so ever and I'd prefer someone closer.  Pleasae forgive me for bothering you with this trivia but to be frank, I'm giving serious consideration to putting that cpap in the closet.      I have made pt aware that for some reason a prescription had not been sent at last visit to have his CPAP supplies renewed and have stated to him that we will get that part taken care of.  Please let us know if you are fine with [place] trying to switch pt's DME.

## 2020-10-11 NOTE — Telephone Encounter (Signed)
His current CPAP machine is a [employer]Med AirSense Autoset, 5-20, mask of choice, humidifier, supplies,, Air/view/ card  Current download shows 87% compliance with AHI 7.7/ hr. That's a lot better than his AHI on his sleep study, which showed about 19 apneas/ hour, so CPAP is working.   For now, let's try to get him a new DME to take over from [facility] at his request ([facility] too far from home). Ask new DME to refit his mask please.

## 2020-10-13 ENCOUNTER — Ambulatory Visit: Payer: Medicare Other

## 2020-10-16 NOTE — Telephone Encounter (Signed)
Patient wanted to know if he could bring his machine to [place] to be serviced. I called [facility] and spoke with Gilda. She stated that she would check with the manager of the CPAP department and then reach out to the patient. Will send a message to patient to let him know to expect a phone call from the [place] office.

## 2020-10-23 ENCOUNTER — Ambulatory Visit: Payer: Medicare Other

## 2021-06-01 NOTE — Progress Notes (Deleted)
HPI male former smoker followed for asthma/bronchitis, allergic rhinitis, OSA, complicated by prior history OSA, GERD, AFib,  Office Spirometry 03/22/2016-moderate restriction of exhaled volume and moderate obstruction. FVC 2.48/64%, FEV1 1.61/58%, ratio 0.65, FEF 25-75 percent 0.89/44%. PFT 07/11/16-moderate obstructive disease with response to bronchodilator. FVC 2.54/75%, FEV1 1.69/70%, ratio 0.66, TLC 104%, DLCO 88% HST-12/30/2017-AHI 18.9/hour, desaturation to 83%, body weight 267 pounds Walk Test on room air 12/01/17-low saturation 95%, highest heart rate 118. ----------------------------------------------------------------------------------------------------  04/17/20- 80 year old male former smoker followed for asthma/bronchitis, allergic rhinitis, OSA complicated , GERD, AFib/ warfarin, HTN, CAD,  CPAP auto 5-20/[facility] Download compliance 100%, AHI 6.1/ hr      NOTE 13 minutes recorded as Chene-Stokes, implying possible cerebral perfusion lag. Wixela 500, Incruse, Neb Xop 0.63, or Duoneb, albuterol hfa,  Body weight today 266 lbs ---pt is here for cpap compliance .pt has sob whening wearing mask.pt needs you to look at spriometry results Covid vax- 2 Moderna Covid mask increases sense of dyspnea when active. Remains in AFib.  CXR 12/01/17- IMPRESSION: Mild chronic bronchitic changes.  No alveolar pneumonia nor CHF. Thoracic aortic atherosclerosis.  06/04/21- 41year-old male former smoker followed for COPD mixed, allergic rhinitis, OSA complicated , GERD, AFib/ warfarin, HTN, CAD, Covid infection July 2022 - Neb Duoneb, [employer], [place] 500, Incruse, Ventolin hfa, Flonase, -Temazepam 30,  CPAP auto 5-20/[facility] Download- Body weight today- Covid vax-    ROS-see HPI + = positive Constitutional:   No-   weight loss, night sweats, fevers, chills, fatigue, lassitude. HEENT:   No-  headaches, difficulty swallowing, tooth/dental problems, sore throat,       No-  sneezing, itching,  ear ache, nasal congestion, post nasal drip,  CV:  No-   chest pain, orthopnea, PND, swelling in lower extremities, anasarca,                                                          dizziness,  palpitations Resp: + shortness of breath with exertion or at rest.              productive cough,  No non-productive cough,  No- coughing up of blood.              change in color of mucus.  + wheezing.   Skin: No-   rash or lesions. GI:   GU: . MS:  No-   joint pain or swelling.   Neuro-     nothing unusual Psych:  No- change in mood or affect. No depression or anxiety.  No memory loss.  OBJ- Physical Exam General- Alert, Oriented, Affect-appropriate, Distress- none acute, + morbidly obese Skin- rash-none, lesions- none, excoriation- none Lymphadenopathy- none Head- atraumatic            Eyes- Gross vision intact, PERRLA, conjunctivae and secretions clear            Ears- +hard of hearing,+ hearing aids            Nose- Clear, no-Septal dev, mucus, polyps, erosion, perforation             Throat- Mallampati III , mucosa clear , drainage- none, tonsils- atrophic Neck- flexible , trachea midline, no stridor , thyroid nl, carotid no bruit Chest - symmetrical excursion , unlabored  Heart/CV- +IRR +AFib , no murmur , no gallop  , no rub, nl s1 s2                           - JVD- none , edema+1, stasis changes- none, varices- none           Lung-  +diminished, Wheeze- none, unlabored, cough- none , dullness-none, rub- none           Chest wall-  Abd-  Br/ Gen/ Rectal- Not done, not indicated Extrem- cyanosis- none, clubbing, none, atrophy- none, strength- nl, + bilateral knee replacement scars Neuro- grossly intact to observation

## 2021-06-04 ENCOUNTER — Ambulatory Visit: Payer: Medicare Other | Admitting: Internal Medicine

## 2021-08-06 ENCOUNTER — Ambulatory Visit: Payer: Medicare Other | Admitting: Internal Medicine

## 2022-05-14 ENCOUNTER — Telehealth: Payer: Self-pay | Admitting: Internal Medicine

## 2022-05-14 NOTE — Telephone Encounter (Signed)
Pt is calling in regards to having to walk to far from his car to the office and also the waiting room to Dr Roxy Cedar office, suggested an office in [place] would be nice, offered wheel chair assisatnce but pt thought that was to embarrassing to be wheeled in, has not been seen in 2 years due to this/please advise

## 2022-05-15 NOTE — Telephone Encounter (Signed)
Called and spoke with patient, advised him that we would be glad to bring a wc to his vehicle if he is unable to walk into the office and we will come out and get him and we can take him to an exam room in a wheel chair as well.  I explained that a large population of our patients have difficulty walking in and to the rooms and need to use a wheel chair and we are glad to assist any way we can.  Scheduled patient to see Dr. Maple Hudson on August 29th at 3 pm and to arrive by 2:45 pm and to call us when he arrives so we can assist him into the office.  He verbalized understanding.  Nothing further needed.

## 2022-06-02 NOTE — Progress Notes (Signed)
HPI male former smoker followed for asthma/bronchitis, allergic rhinitis, OSA, complicated by prior history OSA, GERD, AFib,  Office Spirometry 03/22/2016-moderate restriction of exhaled volume and moderate obstruction. FVC 2.48/64%, FEV1 1.61/58%, ratio 0.65, FEF 25-75 percent 0.89/44%. PFT 07/11/16-moderate obstructive disease with response to bronchodilator. FVC 2.54/75%, FEV1 1.69/70%, ratio 0.66, TLC 104%, DLCO 88% HST-12/30/2017-AHI 18.9/hour, desaturation to 83%, body weight 267 pounds Walk Test on room air 12/01/17-low saturation 95%, highest heart rate 118. ----------------------------------------------------------------------------------------------------   04/17/20- 81 year old male former smoker followed for asthma/bronchitis, allergic rhinitis, OSA complicated , GERD, AFib/ warfarin, HTN, CAD,  CPAP auto 5-20/[facility] Download compliance 100%, AHI 6.1/ hr      NOTE 13 minutes recorded as Chene-Stokes, implying possible cerebral perfusion lag. Wixela 500, Incruse, Neb Xop 0.63, or Duoneb, albuterol hfa,  Body weight today 266 lbs ---pt is here for cpap compliance .pt has sob whening wearing mask.pt needs you to look at spriometry results Covid vax- 2 Moderna Covid mask increases sense of dyspnea when active. Remains in AFib.  CXR 12/01/17- IMPRESSION: Mild chronic bronchitic changes.  No alveolar pneumonia nor CHF. Thoracic aortic atherosclerosis.  06/04/22- 81 year old male former smoker followed for asthma/bronchitis, allergic rhinitis, OSA complicated , GERD, AFib/ warfarin, HTN, CAD,  -Wixela 500, Incruse, Neb Xop 0.63, or Duoneb, albuterol hfa, CPAP auto 5-20/[facility] Download compliance 100%, AHI 5.9/ hr Body weight today 252 lbs -----Pt f/u for OSA and COPD, pt is having issues w/ CPAP mask. Getting approx. 7hrs/night, pt just isn't satisfied w/ his CPAP use or DME company. He is experiencing SOB as well.  Download reviewed.  Pressure is ranging 11.8-17.2 which is okay for his  settings.  He complains that the masks wear out too early and we discussed mask options.  He can review this with his DME company. Continues warfarin for atrial fibrillation managed by cardiology. Using rescue inhaler 3 or 4 times at night and continuing Wixela inhaler.  Much clear mucus.  No significant acute exacerbation.  ROS-see HPI + = positive Constitutional:   No-   weight loss, night sweats, fevers, chills, fatigue, lassitude. HEENT:   No-  headaches, difficulty swallowing, tooth/dental problems, sore throat,       No-  sneezing, itching, ear ache, nasal congestion, post nasal drip,  CV:  No-   chest pain, orthopnea, PND, swelling in lower extremities, anasarca,                                                          dizziness,  palpitations Resp: + shortness of breath with exertion or at rest.              productive cough,  No non-productive cough,  No- coughing up of blood.              change in color of mucus.  + wheezing.   Skin: No-   rash or lesions. GI:   GU: . MS:  No-   joint pain or swelling.   Neuro-     nothing unusual Psych:  No- change in mood or affect. No depression or anxiety.  No memory loss.  OBJ- Physical Exam General- Alert, Oriented, Affect-appropriate, Distress- none acute, + morbidly obese Skin- rash-none, lesions- none, excoriation- none Lymphadenopathy- none Head- atraumatic  Eyes- Gross vision intact, PERRLA, conjunctivae and secretions clear            Ears- +hard of hearing,+ hearing aids            Nose- Clear, no-Septal dev, mucus, polyps, erosion, perforation             Throat- Mallampati III , mucosa clear , drainage- none, tonsils- atrophic Neck- flexible , trachea midline, no stridor , thyroid nl, carotid no bruit Chest - symmetrical excursion , unlabored           Heart/CV- +pulse is faint and hard to palpate, I think it is irregular +IRR +AFib , no murmur , no gallop  , no rub, nl s1 s2                           - JVD- none ,  edema+1, stasis changes- none, varices- none           Lung-  +diminished, Wheeze- none, unlabored, cough- none , dullness-none, rub- none           Chest wall-  Abd-  Br/ Gen/ Rectal- Not done, not indicated Extrem- +cane Neuro- grossly intact to observation

## 2022-06-04 ENCOUNTER — Encounter: Payer: Self-pay | Admitting: Internal Medicine

## 2022-06-04 ENCOUNTER — Ambulatory Visit: Payer: Medicare Other | Admitting: Internal Medicine

## 2022-06-04 DIAGNOSIS — J449 Chronic obstructive pulmonary disease, unspecified: Secondary | ICD-10-CM

## 2022-06-04 DIAGNOSIS — G4733 Obstructive sleep apnea (adult) (pediatric): Secondary | ICD-10-CM

## 2022-06-04 MED ORDER — FLUTICASONE-SALMETEROL 250-50 MCG/ACT IN AEPB: 1.0000 | INHALATION_SPRAY | Freq: Two times a day (BID) | RESPIRATORY_TRACT | 12 refills | Status: AC

## 2022-06-04 NOTE — Patient Instructions (Signed)
Ok to continue CPAP auto 5-20  Ok to continue current inhaled meds  Please call if we can help

## 2022-06-14 NOTE — Assessment & Plan Note (Signed)
Fair control with Wixela.  Using rescue inhaler a little more often than ideal right now.  We will watch this through season change and consider adjusting meds if necessary.

## 2022-06-14 NOTE — Assessment & Plan Note (Signed)
Benefits from CPAP with good compliance and control.  He will talk with DME about his mask concerns. Plan-continue auto 5-20

## 2022-11-04 ENCOUNTER — Telehealth: Payer: Self-pay | Admitting: Internal Medicine

## 2022-11-04 DIAGNOSIS — J449 Chronic obstructive pulmonary disease, unspecified: Secondary | ICD-10-CM

## 2022-11-04 NOTE — Telephone Encounter (Signed)
[facility] is calling in regards to pulm rehab referral. It was sent from PCP. But [facility] is stating it needs to come from [place].   Sir are you ok with me sending in a referral to pulm rehab?  Thank you

## 2022-11-04 NOTE — Telephone Encounter (Signed)
Yes thanks 

## 2022-11-04 NOTE — Telephone Encounter (Signed)
Rehab calling [facility] in [place].   They got a referral from this PT PCP.In order to comply w/ [place] law the referral has to come from Dr. Annamaria Boots.   he PT needs to be referred for Pulmonary Rehab. Does Dr. Annamaria Boots need to see PT first and issue referral or can Dr. Annamaria Boots send the referral straight away?  Questions? Please call Margreta Journey @ [phone]    Dineen Kid was referring Dr.   [phone] is Fax for referral to her.

## 2022-11-04 NOTE — Telephone Encounter (Signed)
Sent in pulm rehab referral to [facility] in [place] per Dr Bertrum Sol approval. Nothing further needed

## 2022-11-07 ENCOUNTER — Telehealth: Payer: Self-pay | Admitting: Internal Medicine

## 2022-11-07 NOTE — Telephone Encounter (Signed)
Waiting on form to come back from Dr Annamaria Boots

## 2022-11-08 NOTE — Telephone Encounter (Signed)
Faxed yesterday and confirmation received

## 2022-11-08 NOTE — Telephone Encounter (Signed)
[name], please advise if you received form back from Dr. Annamaria Boots.

## 2023-05-27 ENCOUNTER — Telehealth: Payer: Self-pay | Admitting: Internal Medicine

## 2023-05-27 DIAGNOSIS — G4733 Obstructive sleep apnea (adult) (pediatric): Secondary | ICD-10-CM

## 2023-05-27 DIAGNOSIS — J449 Chronic obstructive pulmonary disease, unspecified: Secondary | ICD-10-CM

## 2023-05-27 NOTE — Telephone Encounter (Signed)
Patient is calling because his CPAP and supplies for nebulizer is from [facility] but they will no longer be providing it for him because they are no longer with [employer]. He needs the prescriptions to be sent [facility] so that he can receive his supplies.Can be faxed to [phone]. He can be reached at [phone]

## 2023-06-04 NOTE — Progress Notes (Signed)
HPI male former smoker followed for asthma/bronchitis, allergic rhinitis, OSA, complicated by prior history OSA, GERD, AFib,  Office Spirometry 03/22/2016-moderate restriction of exhaled volume and moderate obstruction. FVC 2.48/64%, FEV1 1.61/58%, ratio 0.65, FEF 25-75 percent 0.89/44%. PFT 07/11/16-moderate obstructive disease with response to bronchodilator. FVC 2.54/75%, FEV1 1.69/70%, ratio 0.66, TLC 104%, DLCO 88% HST-12/30/2017-AHI 18.9/hour, desaturation to 83%, body weight 267 pounds Walk Test on room air 12/01/17-low saturation 95%, highest heart rate 118. ----------------------------------------------------------------------------------------------------   06/04/22- 82 year old male former smoker followed for asthma/bronchitis, allergic rhinitis, OSA complicated , GERD, AFib/ warfarin, HTN, CAD,  -Wixela 500, Incruse, Neb Xop 0.63, or Duoneb, albuterol hfa, CPAP auto 5-20/[facility] Download compliance 100%, AHI 5.9/ hr Body weight today 252 lbs -----Pt f/u for OSA and COPD, pt is having issues w/ CPAP mask. Getting approx. 7hrs/night, pt just isn't satisfied w/ his CPAP use or DME company. He is experiencing SOB as well.  Download reviewed.  Pressure is ranging 11.8-17.2 which is okay for his settings.  He complains that the masks wear out too early and we discussed mask options.  He can review this with his DME company. Continues warfarin for atrial fibrillation managed by cardiology. Using rescue inhaler 3 or 4 times at night and continuing Wixela inhaler.  Much clear mucus.  No significant acute exacerbation.  06/05/23- 82 year old male former smoker followed for asthma/bronchitis, allergic rhinitis, OSA complicated , GERD, AFib/ warfarin, HTN, CAD,  -Wixela 500, Incruse, Neb Xop 0.63, or Duoneb, albuterol hfa, CPAP auto 5-20/[facility] AirSense 10 AutoSet Download compliance 100%, AHI 6/ hr Body weight today 236 lbs Insurance had required change from [facility] to [facility].  Download reviewed.   He continues using CPAP every night with benefit. On waking in the morning he uses Advair and Flonase.  These seem to trigger watery rhinorrhea which I suggested was actually rebound from drying during the night.  We discussed nasal saline gel and I suggested he try using the Flonase a little bit later in the morning to see if that made any difference. Control of his asthmatic bronchitis has been pretty good without exacerbation.  ROS-see HPI + = positive Constitutional:   No-   weight loss, night sweats, fevers, chills, fatigue, lassitude. HEENT:   No-  headaches, difficulty swallowing, tooth/dental problems, sore throat,       No-  sneezing, itching, ear ache, nasal congestion, post nasal drip,  CV:  No-   chest pain, orthopnea, PND, swelling in lower extremities, anasarca,                                                          dizziness,  palpitations Resp: + shortness of breath with exertion or at rest.              productive cough,  No non-productive cough,  No- coughing up of blood.              change in color of mucus.  + wheezing.   Skin: No-   rash or lesions. GI:   GU: . MS:  No-   joint pain or swelling.   Neuro-     nothing unusual Psych:  No- change in mood or affect. No depression or anxiety.  No memory loss.  OBJ- Physical Exam General- Alert, Oriented, Affect-appropriate, Distress- none acute, +  obese Skin- rash-none, lesions- none, excoriation- none Lymphadenopathy- none Head- atraumatic            Eyes- Gross vision intact, PERRLA, conjunctivae and secretions clear            Ears- +hard of hearing,+ hearing aids            Nose- Clear, no-Septal dev, mucus, polyps, erosion, perforation             Throat- Mallampati III , mucosa clear , drainage- none, tonsils- atrophic Neck- flexible , trachea midline, no stridor , thyroid nl, carotid no bruit Chest - symmetrical excursion , unlabored           Heart/CV- +pulse is faint and hard to palpate, I think it is  irregular +IRR +AFib , no murmur , no gallop  , no rub, nl s1 s2                           - JVD- none , edema+1, stasis changes- none, varices- none           Lung-  +diminished, Wheeze- none, unlabored, cough- none , dullness-none, rub- none           Chest wall-  Abd-  Br/ Gen/ Rectal- Not done, not indicated Extrem- +cane Neuro- grossly intact to observation

## 2023-06-05 ENCOUNTER — Ambulatory Visit: Payer: Medicare Other | Admitting: Internal Medicine

## 2023-06-05 ENCOUNTER — Encounter: Payer: Self-pay | Admitting: Internal Medicine

## 2023-06-05 VITALS — BP 122/80 | HR 83 | Ht 69.5 in | Wt 236.0 lb

## 2023-06-05 DIAGNOSIS — J449 Chronic obstructive pulmonary disease, unspecified: Secondary | ICD-10-CM | POA: Diagnosis not present

## 2023-06-05 DIAGNOSIS — G4733 Obstructive sleep apnea (adult) (pediatric): Secondary | ICD-10-CM | POA: Diagnosis not present

## 2023-06-05 NOTE — Telephone Encounter (Signed)
New order placed

## 2023-06-05 NOTE — Patient Instructions (Signed)
Order- [facility]- please change from [facility] to [facility] to continue providing for CPAP auto 5-20, mask of choice, mask of choice, humidifier, supplies, AirView/ card. Change required by insurance change.  We can continue current meds. Please call if we can help

## 2023-06-06 ENCOUNTER — Encounter: Payer: Self-pay | Admitting: Internal Medicine

## 2023-06-06 NOTE — Assessment & Plan Note (Signed)
Moderate persistent uncomplicated. Plan-continue Advair/Wixela, Incruse, albuterol

## 2023-06-06 NOTE — Assessment & Plan Note (Signed)
Benefits from CPAP with good compliance and control Plan-as required by insurance, changed DME to [facility], continuing auto 5-20

## 2023-07-01 ENCOUNTER — Encounter: Payer: Self-pay | Admitting: Internal Medicine

## 2023-12-06 ENCOUNTER — Encounter: Payer: Self-pay | Admitting: Internal Medicine

## 2023-12-09 NOTE — Telephone Encounter (Signed)
 I left a message for the patient to return my call.

## 2023-12-31 ENCOUNTER — Telehealth: Payer: Self-pay

## 2023-12-31 DIAGNOSIS — G4733 Obstructive sleep apnea (adult) (pediatric): Secondary | ICD-10-CM

## 2023-12-31 DIAGNOSIS — J449 Chronic obstructive pulmonary disease, unspecified: Secondary | ICD-10-CM

## 2023-12-31 NOTE — Telephone Encounter (Signed)
 Please send order to [facility] to continue CPAP auto 5-20, mask of his choice, humidifier, supplies, AirView/ card. Also please send them my last ov note from 05/2023, and his diagnostic sleep study result from 2019. Thanks.

## 2023-12-31 NOTE — Telephone Encounter (Signed)
 See patient message encounter information from 12/06/2023.  Copied last note from Dr. Maple Hudson here:  William Budge, MD    12/31/23  2:12 PM Note Please send order to [facility] to continue CPAP auto 5-20, mask of his choice, humidifier, supplies, AirView/ card. Also please send them my last ov note from 05/2023, and his diagnostic sleep study result from 2019. Thanks.     Called patient.  Patient wants to get a new CPAP as his is over 84 years old and not working properly and shutting off during the night.  Patient needs all supplies and specifically wants this mask:  Fisher Pykel Full Face Mask - [place] full 400 EVF 115 N&P full feel  SMALL-MEDIUM.  Patient also needs new supplies for his nebulizer, maybe a new nebulizer machine.  Okay per Dr. Maple Hudson to send in order.  Sent in. Patient has Medicare so order thru [facility].

## 2024-01-07 ENCOUNTER — Telehealth: Payer: Self-pay | Admitting: Internal Medicine

## 2024-01-07 NOTE — Telephone Encounter (Signed)
 Called [facility] and had a rep refax the order. I have rec'd it and will put in Dr. Lenoria Farrier box for signature.

## 2024-01-07 NOTE — Telephone Encounter (Signed)
 Message from [place] states they have sent a order for a CPAP twice. Nothing rec'd will try to call. No call back # left.

## 2024-01-07 NOTE — Telephone Encounter (Signed)
 Can not fax because fax was cut off. Called [facility] and asked them to refax.

## 2024-01-09 NOTE — Telephone Encounter (Signed)
 We have not tec'd and returned signed fax yet because [name] has failed to send a order thru [facility]. Did rec cut off fax and called [facility] 3 times for a new copy to be sent and fax'd req that new copy be sent twice. [name] has now and will email a [facility] rep as well. I called PT how is in distress because this has been going on so long and updated him. Next step is get good copy to Dr. Jeannie Fend to sign and fax back to [facility].

## 2024-01-09 NOTE — Telephone Encounter (Signed)
 I have sent the referral/order to [facility].  Nothing further needed at this time.

## 2024-02-10 ENCOUNTER — Telehealth: Payer: Self-pay

## 2024-02-10 NOTE — Telephone Encounter (Signed)
 I sent this to [facility] via fax on April 25th. I rec'd a confirmation fax went thru and called PT to let him know it did. It has already gone to scan  and has not been scanned in yet.

## 2024-02-10 NOTE — Telephone Encounter (Signed)
 Copied from CRM [id number]. Topic: General - Other >> Feb 06, 2024 12:53 PM Alverda Joe S wrote: Reason for CRM: [facility] calling to check to see if received dwo fax regarding cpap supplies >> Feb 06, 2024  1:36 PM Glenora Laos B wrote: These are done by the front office now we do not get the faxes anymore    Randee Busing, it looks like you handled this with [facility] on 01/07/2024.  Have you received these recent two faxes [facility] is calling about?  Thank you!

## 2024-06-02 NOTE — Progress Notes (Signed)
 HPI male former smoker followed for asthma/bronchitis, allergic rhinitis, OSA, complicated by prior history OSA, GERD, AFib,  Office Spirometry 03/22/2016-moderate restriction of exhaled volume and moderate obstruction. FVC 2.48/64%, FEV1 1.61/58%, ratio 0.65, FEF 25-75 percent 0.89/44%. PFT 07/11/16-moderate obstructive disease with response to bronchodilator. FVC 2.54/75%, FEV1 1.69/70%, ratio 0.66, TLC 104%, DLCO 88% HST-12/30/2017-AHI 18.9/hour, desaturation to 83%, body weight 267 pounds Walk Test on room air 12/01/17-low saturation 95%, highest heart rate 118. ----------------------------------------------------------------------------------------------------   06/05/23- 83 year old male former smoker followed for asthma/bronchitis, allergic rhinitis, OSA complicated , GERD, AFib/ warfarin, HTN, CAD,  -Wixela 500, Incruse, Neb Xop 0.63, or Duoneb, albuterol  hfa, CPAP auto 5-20/[employer] AirSense 10 AutoSet Download compliance 100%, AHI 6/ hr Body weight today 236 lbs Insurance had required change from [employer] to [employer].  Download reviewed.  He continues using CPAP every night with benefit. On waking in the morning he uses Advair and Flonase .  These seem to trigger watery rhinorrhea which I suggested was actually rebound from drying during the night.  We discussed nasal saline gel and I suggested he try using the Flonase  a little bit later in the morning to see if that made any difference. Control of his asthmatic bronchitis has been pretty good without exacerbation.  06/03/24-  83 year old male former smoker followed for asthma/bronchitis, allergic rhinitis, OSA complicated , GERD, AFib/ warfarin, HTN, CAD, Peripheral Venous Insufficiency,  -Wixela 500, Incruse, Neb Xop 0.63, or Duoneb, albuterol  hfa, CPAP auto 5-20/[employer] AirSense 10 AutoSet Download compliance 90%, AHI 8.9/hr    range 10.8-17.4    moderately high leak Body weight today 233 lbs ------Not going to bed until 4 am until wife goes  to bed Discussed the use of AI scribe software for clinical note transcription with the patient, who gave verbal consent to proceed.  History of Present Illness   [name] is an 83 year old male with asthma, OSA and atrial fibrillation who presents for a follow-up visit.  He experiences increased nasal congestion with mucus described as having a thick consistency. He uses salt water nose spray and Flonase  effectively.  He uses a generic version of Advair, taking three puffs for adequate inhalation, and an albuterol  rescue inhaler as needed, ranging from a couple of times a week to three or four times a day. Mucus buildup can make breathing more difficult.   He is comfortable with his CPAP good download and compliance using auto 5-20.  His chest feels 'pretty good most of the time.'     Assessment and Plan:    Obstructive sleep apnea -benefit from CPAP with good compliance and control -continue auto 5-20   Allergic rhinitis Symptoms align with seasonal allergic rhinitis, likely worsened by fall allergens. - Recommend saline nasal spray for symptom relief. - Prescribe Flonase  nasal spray.  Asthma moderate persistent uncomplicated Asthma with current medication. Variable albuterol  use noted, clear lung sounds. - Continue Advair or Wixela regimen. - Ensure albuterol  rescue inhaler availability. - Order chest x-ray, last done six years ago.     ROS-see HPI + = positive Constitutional:   No-   weight loss, night sweats, fevers, chills, fatigue, lassitude. HEENT:   No-  headaches, difficulty swallowing, tooth/dental problems, sore throat,       No-  sneezing, itching, ear ache, nasal congestion, post nasal drip,  CV:  No-   chest pain, orthopnea, PND, swelling in lower extremities, anasarca,  dizziness,  palpitations Resp: + shortness of breath with exertion or at rest.              productive cough,  No non-productive  cough,  No- coughing up of blood.              change in color of mucus.  + wheezing.   Skin: No-   rash or lesions. GI:   GU: . MS:  No-   joint pain or swelling.   Neuro-     nothing unusual Psych:  No- change in mood or affect. No depression or anxiety.  No memory loss.  OBJ- Physical Exam General- Alert, Oriented, Affect-appropriate, Distress- none acute, +  obese Skin- rash-none, lesions- none, excoriation- none Lymphadenopathy- none Head- atraumatic            Eyes- Gross vision intact, PERRLA, conjunctivae and secretions clear            Ears- +hard of hearing,+ hearing aids            Nose- Clear, no-Septal dev, mucus, polyps, erosion, perforation             Throat- Mallampati III , mucosa clear , drainage- none, tonsils- atrophic Neck- flexible , trachea midline, no stridor , thyroid nl, carotid no bruit Chest - symmetrical excursion , unlabored           Heart/CV- +pulse is faint and hard to palpate, I think it is irregular +IRR +AFib , no murmur , no gallop  , no rub, nl s1 s2                           - JVD- none , edema+1, stasis changes- none, varices- none           Lung-  +diminished, Wheeze- none, unlabored, cough- none , dullness-none, rub- none           Chest wall-  Abd-  Br/ Gen/ Rectal- Not done, not indicated Extrem- +cane, +stasis dermatitis Neuro- grossly intact to observation

## 2024-06-03 ENCOUNTER — Ambulatory Visit

## 2024-06-03 ENCOUNTER — Encounter: Payer: Self-pay | Admitting: Internal Medicine

## 2024-06-03 ENCOUNTER — Ambulatory Visit: Payer: Medicare Other | Admitting: Internal Medicine

## 2024-06-03 VITALS — BP 122/61 | HR 71 | Temp 98.5°F | Ht 70.0 in | Wt 233.6 lb

## 2024-06-03 DIAGNOSIS — J449 Chronic obstructive pulmonary disease, unspecified: Secondary | ICD-10-CM | POA: Diagnosis not present

## 2024-06-03 NOTE — Patient Instructions (Signed)
 Order- CXR  dx COPD mixed type  Ok to continue current meds

## 2024-06-15 ENCOUNTER — Ambulatory Visit: Payer: Self-pay | Admitting: Internal Medicine

## 2024-07-04 ENCOUNTER — Encounter: Payer: Self-pay | Admitting: Internal Medicine

## 2024-07-06 NOTE — Telephone Encounter (Signed)
**Note De-identified  [place] Obfuscation** Please advise 

## 2024-07-06 NOTE — Telephone Encounter (Signed)
 Ignore the politicians. I have gotten the flu and covid vaccines myself this Fall, and I think they are a good iddea.
